# Patient Record
Sex: Female | Born: 1985 | Race: Black or African American | Hispanic: No | Marital: Single | Smoking: Never smoker
Health system: Southern US, Community
[De-identification: ages and names within clinical notes are randomized; demographics above are authoritative.]

## PROBLEM LIST (undated history)

## (undated) DIAGNOSIS — D649 Anemia, unspecified: Secondary | ICD-10-CM

## (undated) DIAGNOSIS — F419 Anxiety disorder, unspecified: Secondary | ICD-10-CM

## (undated) DIAGNOSIS — E119 Type 2 diabetes mellitus without complications: Secondary | ICD-10-CM

## (undated) DIAGNOSIS — O24419 Gestational diabetes mellitus in pregnancy, unspecified control: Secondary | ICD-10-CM

## (undated) HISTORY — DX: Type 2 diabetes mellitus without complications: E11.9

## (undated) HISTORY — DX: Anemia, unspecified: D64.9

## (undated) HISTORY — DX: Anxiety disorder, unspecified: F41.9

## (undated) HISTORY — DX: Gestational diabetes mellitus in pregnancy, unspecified control: O24.419

## (undated) HISTORY — PX: THERAPEUTIC ABORTION: SHX798

---

## 2019-04-20 NOTE — L&D Delivery Note (Addendum)
Morgan Foster is a 34 y.o.@ s/p vaginal delivery at [redacted]w[redacted]d.  She was admitted for IOL in the setting of poorly controlled A2GDM.    ROM: 0h 75m with clear fluid GBS Status: positive> treated with PCN Maximum Maternal Temperature: 99.2 F   Labor Progress: Pt in latent labor on admission.  She continued to progress well. Pitocin was initiated and titrated appropriately. AROM was performed for clear fluid. Patient then was noted to have complete cervical dilation. She then delivered without complication as noted below.   Delivery Date/Time: 03/27/2020 at 2129 Delivery: Called to room and patient was complete and pushing. Head delivered LOA. No nuchal cord present. Shoulder and body delivered in usual fashion. Infant with spontaneous cry, placed on mother's abdomen, dried and stimulated. Cord clamped x 2 after 1-minute delay, and cut by father of baby under my direct supervision. Cord blood drawn. Placenta delivered spontaneously with gentle cord traction. Fundus firm with massage and Pitocin. Labia, perineum, vagina, and cervix were inspected; 1st degree laceration visualized with repair.    Placenta: intact, 3-vessel cord, sent to L&D Complications: none Lacerations: 1st degree right labial with repair EBL: 680 ml Analgesia: fentanyl prn   Infant: female  APGARs 8 & 9  weight per medical record   All of the above by Dr. Reece Leader under my direct supervision.  Dr Shawnie Pons also in room d/t hx SD.   Morgan Foster, CNM

## 2020-01-30 ENCOUNTER — Inpatient Hospital Stay (HOSPITAL_COMMUNITY): Admit: 2020-01-30 | Payer: Self-pay | Admitting: Obstetrics & Gynecology

## 2020-02-14 LAB — SICKLE CELL SCREEN: Sickle Cell Screen: NORMAL

## 2020-02-14 LAB — OB RESULTS CONSOLE RPR: RPR: NONREACTIVE

## 2020-02-14 LAB — OB RESULTS CONSOLE HGB/HCT, BLOOD
HCT: 25 — AB (ref 29–41)
Hemoglobin: 8.8

## 2020-02-14 LAB — OB RESULTS CONSOLE GC/CHLAMYDIA
Chlamydia: NEGATIVE
Gonorrhea: NEGATIVE

## 2020-02-14 LAB — GLUCOSE TOLERANCE, 1 HOUR: Glucose, 1 Hour GTT: 159

## 2020-02-14 LAB — OB RESULTS CONSOLE HIV ANTIBODY (ROUTINE TESTING): HIV: NONREACTIVE

## 2020-02-18 LAB — GLUCOSE TOLERANCE, 3 HOURS
Glucose, GTT - 1 Hour: 169 (ref ?–200)
Glucose, GTT - 2 Hour: 173 — AB (ref ?–140)
Glucose, GTT - 3 Hour: 137 mg/dL (ref ?–140)
Glucose, GTT - Fasting: 97 mg/dL (ref 80–110)

## 2020-02-21 ENCOUNTER — Encounter: Payer: Self-pay | Attending: Obstetrics & Gynecology | Admitting: Registered"

## 2020-02-21 ENCOUNTER — Other Ambulatory Visit: Payer: Self-pay

## 2020-02-21 ENCOUNTER — Ambulatory Visit: Payer: Self-pay | Admitting: Registered"

## 2020-02-21 DIAGNOSIS — O24419 Gestational diabetes mellitus in pregnancy, unspecified control: Secondary | ICD-10-CM | POA: Insufficient documentation

## 2020-02-21 NOTE — Progress Notes (Signed)
Patient is scheduled for new OB appointment 03/04/20  Patient was seen on 02/21/20 for Gestational Diabetes self-management. EDD Patient states she was told she is measuring 36W.   Patient arrived 40 minutes late for appointment, likely due to having to rely on others for transportation. Today we only had enough time to teach her how to check her blood sugar. Pt states she will return within a week to complete GDM education.  Blood glucose monitor given: Prodigy Lot # [id number] CBG: 130 mg/dL  Patient instructed to monitor glucose levels: FBS: 60 - 95 mg/dl 2 hour: <829 mg/dl  Patient received the following handouts:  Blood glucose Log Sheet  Patient will be seen for follow-up ASAP.

## 2020-02-25 ENCOUNTER — Encounter: Payer: Self-pay | Admitting: *Deleted

## 2020-02-27 ENCOUNTER — Encounter: Payer: Self-pay | Admitting: General Practice

## 2020-02-28 ENCOUNTER — Encounter: Payer: Self-pay | Admitting: *Deleted

## 2020-03-03 ENCOUNTER — Other Ambulatory Visit: Payer: Self-pay | Admitting: *Deleted

## 2020-03-03 ENCOUNTER — Other Ambulatory Visit: Payer: Self-pay

## 2020-03-03 DIAGNOSIS — O24419 Gestational diabetes mellitus in pregnancy, unspecified control: Secondary | ICD-10-CM

## 2020-03-04 ENCOUNTER — Other Ambulatory Visit: Payer: Self-pay

## 2020-03-04 ENCOUNTER — Other Ambulatory Visit: Payer: Self-pay | Admitting: Obstetrics & Gynecology

## 2020-03-04 ENCOUNTER — Encounter: Payer: Self-pay | Admitting: Registered"

## 2020-03-04 ENCOUNTER — Ambulatory Visit (INDEPENDENT_AMBULATORY_CARE_PROVIDER_SITE_OTHER): Payer: Self-pay | Admitting: Obstetrics & Gynecology

## 2020-03-04 ENCOUNTER — Ambulatory Visit: Payer: Self-pay | Admitting: Registered"

## 2020-03-04 ENCOUNTER — Other Ambulatory Visit: Payer: Self-pay | Admitting: *Deleted

## 2020-03-04 ENCOUNTER — Encounter: Payer: Self-pay | Admitting: Obstetrics & Gynecology

## 2020-03-04 ENCOUNTER — Ambulatory Visit: Payer: Self-pay | Attending: Obstetrics and Gynecology

## 2020-03-04 VITALS — BP 124/79 | Wt 231.1 lb

## 2020-03-04 DIAGNOSIS — O24419 Gestational diabetes mellitus in pregnancy, unspecified control: Secondary | ICD-10-CM

## 2020-03-04 DIAGNOSIS — Z363 Encounter for antenatal screening for malformations: Secondary | ICD-10-CM

## 2020-03-04 DIAGNOSIS — O0990 Supervision of high risk pregnancy, unspecified, unspecified trimester: Secondary | ICD-10-CM | POA: Insufficient documentation

## 2020-03-04 LAB — POCT URINALYSIS DIP (DEVICE)
Bilirubin Urine: NEGATIVE
Glucose, UA: 100 mg/dL — AB
Ketones, ur: NEGATIVE mg/dL
Leukocytes,Ua: NEGATIVE
Nitrite: NEGATIVE
Protein, ur: NEGATIVE mg/dL
Specific Gravity, Urine: >=1.03 (ref 1.005–1.030)
Urobilinogen, UA: 0.2 mg/dL (ref 0.0–1.0)
pH: 6.5 (ref 5.0–8.0)

## 2020-03-04 NOTE — Progress Notes (Signed)
Patient was seen on 03/04/20 for follow-up assessment and education for Gestational Diabetes. EDD 04/14/20.  Last visit patient there was only enough time to teach how to check blood sugar. We were able to cover the rest of the material in the handout and discuss some of the meals she is eating now.  Pt states her fiancee is [place] and likes to cook rice and beans but states she only eats very small portions. Pt states today he is cooking an apple pie.  Patient states that she is having a difficult time starting new habits such as taking her vitamins and checking her blood sugar. Pt states she is still in the process of trying to get her things moved here from [place]. Pt states her fiancee is not working so they do not have income to pay rent and is worried about the possibility of getting kicked out of their place.   Pt states she has some cousins who live in [place] and when visiting them decided to stay here. Pt states one cousin is a vegan and this cousin has been trying to help her out with food choices.   The following learning objectives reviewed during follow-up visit:   How to create balanced meals  Role of stress  Discussed barriers to checking blood sugar  Plan:  . monitor glucose levels: FBS: 60 - 95 mg/dl 2 hour: <829 mg/dl . Adjust meals to help bring blood sugar within range . Return in 1 week to review blood sugar log  Patient received the following handouts:  Blood sugar log (one touch)  Patient will be seen for follow-up in 1 weeks or as needed.

## 2020-03-04 NOTE — Progress Notes (Signed)
  Subjective:    Morgan Foster is a [id number] [redacted]w[redacted]d being seen today for her first obstetrical visit.  Her obstetrical history is significant for gestational diabetes and inadequate prenatal care. Patient does intend to breast feed. Pregnancy history fully reviewed. She needs to complete her DM education today and have an anatomy scan  Patient reports occasional contractions and pressure.  Vitals:   03/04/20 0909  BP: 124/79  Weight: 231 lb 1.6 oz (104.8 kg)    HISTORY: OB History  Gravida Para Term Preterm AB Living  7 2 2  0 4 2  SAB TAB Ectopic Multiple Live Births  0 4 0 0 2    # Outcome Date GA Lbr Len/2nd Weight Sex Delivery Anes PTL Lv  7 Current           6 Term 04/04/13 [redacted]w[redacted]d  7 lb 11 oz (3.487 kg) F Vag-Spont     5 Term 12/30/08 [redacted]w[redacted]d  8 lb 2 oz (3.685 kg) F Vag-Spont     4 TAB           3 TAB           2 TAB           1 TAB            No past medical history on file.  Family History  Problem Relation Age of Onset  . Hypertension Mother   . Diabetes Mother      Exam    Uterus:   35 cm FH  Pelvic Exam:                               System:     Skin: normal coloration and turgor, no rashes    Neurologic: oriented, normal mood   Extremities: normal strength, tone, and muscle mass   HEENT PERRLA   Mouth/Teeth    Neck supple   Cardiovascular: regular rate and rhythm   Respiratory:  appears well, vitals normal, no respiratory distress, acyanotic, normal RR   Abdomen: gravid          Assessment:    Pregnancy: [redacted]w[redacted]d Patient Active Problem List   Diagnosis Date Noted  . Supervision of high risk pregnancy, antepartum 03/04/2020  . Gestational diabetes mellitus (GDM), antepartum 02/21/2020        Plan:     Initial labs drawn. Prenatal vitamins. Problem list reviewed and updated. Genetic Screening discussed .  Ultrasound discussed; fetal survey: ordered.  Follow up in 1 weeks. 50% of 30 min visit spent on counseling and coordination  of care.  To 13/07/2019 now, DM eduacation   [place] 03/05/2020

## 2020-03-04 NOTE — Patient Instructions (Signed)
Gestational Diabetes Mellitus, Diagnosis Gestational diabetes (gestational diabetes mellitus) is a short-term (temporary) form of diabetes that can happen during pregnancy. It goes away after you give birth. It may be caused by one or both of these problems:  Your pancreas does not make enough of a hormone called insulin.  Your body does not respond in a normal way to insulin that it makes. Insulin lets sugars (glucose) go into cells in the body. This gives you energy. If you have diabetes, sugars cannot get into cells. This causes high blood sugar (hyperglycemia). If you get gestational diabetes, you are:  More likely to get it if you get pregnant again.  More likely to develop type 2 diabetes in the future. If gestational diabetes is treated, it may not hurt you or your baby. Your doctor will set treatment goals for you. In general, you should have these blood sugar levels:  After not eating for a long time (fasting): 95 mg/dL (5.3 mmol/L).  After meals (postprandial): ? One hour after a meal: at or below 140 mg/dL (7.8 mmol/L). ? Two hours after a meal: at or below 120 mg/dL (6.7 mmol/L).  A1c (hemoglobin A1c) level: 6-6.5%. Follow these instructions at home: Questions to ask your doctor   You may want to ask these questions: ? Do I need to meet with a diabetes educator? ? What equipment will I need to care for myself at home? ? What medicines do I need? When should I take them? ? How often do I need to check my blood sugar? ? What number can I call if I have questions? ? When is my next doctor's visit? General instructions  Take over-the-counter and prescription medicines only as told by your doctor.  Stay at a healthy weight during pregnancy.  Keep all follow-up visits as told by your doctor. This is important. Contact a doctor if:  Your blood sugar is at or above 240 mg/dL (13.3 mmol/L).  Your blood sugar is at or above 200 mg/dL (11.1 mmol/L) and you have ketones in  your pee (urine).  You have been sick or have had a fever for 2 days or more and you are not getting better.  You have any of these problems for more than 6 hours: ? You cannot eat or drink. ? You feel sick to your stomach (nauseous). ? You throw up (vomit). ? You have watery poop (diarrhea). Get help right away if:  Your blood sugar is lower than 54 mg/dL (3 mmol/L).  You get confused.  You have trouble: ? Thinking clearly. ? Breathing.  Your baby moves less than normal.  You have any of these: ? Moderate or large ketone levels in your pee. ? Blood coming from your vagina. ? Unusual fluid coming from your vagina. ? Early contractions. These may feel like tightness in your belly. Summary  Gestational diabetes is a short-term form of diabetes. It can happen while you are pregnant. It goes away after you give birth.  If gestational diabetes is treated, it may not hurt you or your baby. Your doctor will set treatment goals for you.  Keep all follow-up visits as told by your doctor. This is important. This information is not intended to replace advice given to you by your health care provider. Make sure you discuss any questions you have with your health care provider. Document Revised: 05/12/2017 Document Reviewed: 05/09/2015 Elsevier Patient Education  2020 [employer]  

## 2020-03-06 ENCOUNTER — Other Ambulatory Visit: Payer: Self-pay

## 2020-03-11 ENCOUNTER — Ambulatory Visit: Payer: Self-pay | Admitting: *Deleted

## 2020-03-11 ENCOUNTER — Ambulatory Visit (INDEPENDENT_AMBULATORY_CARE_PROVIDER_SITE_OTHER): Payer: Self-pay | Admitting: Family Medicine

## 2020-03-11 ENCOUNTER — Other Ambulatory Visit: Payer: Self-pay

## 2020-03-11 ENCOUNTER — Ambulatory Visit: Payer: Self-pay | Attending: Maternal & Fetal Medicine

## 2020-03-11 ENCOUNTER — Ambulatory Visit: Payer: Self-pay | Admitting: Registered"

## 2020-03-11 ENCOUNTER — Encounter: Payer: Self-pay | Admitting: *Deleted

## 2020-03-11 ENCOUNTER — Encounter: Payer: Self-pay | Admitting: Registered"

## 2020-03-11 VITALS — BP 110/54 | HR 87

## 2020-03-11 VITALS — BP 109/72 | HR 96 | Wt 234.9 lb

## 2020-03-11 DIAGNOSIS — O0990 Supervision of high risk pregnancy, unspecified, unspecified trimester: Secondary | ICD-10-CM

## 2020-03-11 DIAGNOSIS — O24410 Gestational diabetes mellitus in pregnancy, diet controlled: Secondary | ICD-10-CM | POA: Insufficient documentation

## 2020-03-11 DIAGNOSIS — O403XX0 Polyhydramnios, third trimester, not applicable or unspecified: Secondary | ICD-10-CM

## 2020-03-11 DIAGNOSIS — O24419 Gestational diabetes mellitus in pregnancy, unspecified control: Secondary | ICD-10-CM

## 2020-03-11 DIAGNOSIS — Z3A35 35 weeks gestation of pregnancy: Secondary | ICD-10-CM

## 2020-03-11 NOTE — Progress Notes (Signed)
Patient was seen on 03/11/20 for follow-up assessment and education for Gestational Diabetes. EDD 04/14/20. [redacted]w[redacted]d  Patient states that she continues to have difficultly remembering to take vitamins and check blood sugar. Pt states she has only checked once since last visit.  Yesterday FBS was 110 mg/dL  Dietary recall: Breakfast: frozen waffle, small drizzle syrup, water Snack: fruit Lunch: sandwich Dinner: yellow rice (1 c), bbq ribs, broccoli, corn on the cob with sugar and butter.  The following learning objectives reviewed during follow-up visit:   Importance of monitoring blood sugar readings  Identifying carbs in diet  Timing of dinner and potential effect on fasting blood sugar   Plan:  . Set alarm on phone to remember to monitor glucose levels: FBS: 60 - 95 mg/dl 2 hour: <045 mg/dl . Review carb list on yellow card . Adjust meals to help bring blood sugar within range . Return in 1 week to review blood sugar log  Patient received the following handouts:  Blood sugar log (one touch)  Patient will be seen for follow-up in 1 weeks or as needed.

## 2020-03-11 NOTE — Progress Notes (Signed)
   Subjective:  Morgan Foster is a 34 y.o. [id number] at [redacted]w[redacted]d being seen today for ongoing prenatal care.  She is currently monitored for the following issues for this high-risk pregnancy and has Gestational diabetes mellitus (GDM), antepartum and Supervision of high risk pregnancy, antepartum on their problem list.  Patient reports no complaints.  Contractions: Irregular. Vag. Bleeding: None.  Movement: Present. Denies leaking of fluid.   The following portions of the patient's history were reviewed and updated as appropriate: allergies, current medications, past family history, past medical history, past social history, past surgical history and problem list. Problem list updated.  Objective:   Vitals:   03/11/20 1332  BP: 109/72  Pulse: 96  Weight: 234 lb 14.4 oz (106.5 kg)    Fetal Status: Fetal Heart Rate (bpm): 143   Movement: Present  Presentation: Vertex  General:  Alert, oriented and cooperative. Patient is in no acute distress.  Skin: Skin is warm and dry. No rash noted.   Cardiovascular: Normal heart rate noted  Respiratory: Normal respiratory effort, no problems with respiration noted  Abdomen: Soft, gravid, appropriate for gestational age. Pain/Pressure: Present     Pelvic: Vag. Bleeding: None     Cervical exam performed Dilation: 1 Effacement (%): 50 Station: Ballotable  Extremities: Normal range of motion.  Edema: None  Mental Status: Normal mood and affect. Normal behavior. Normal judgment and thought content.   Urinalysis:      Assessment and Plan:  Pregnancy: [id number] at [redacted]w[redacted]d  1. Supervision of high risk pregnancy, antepartum Transfer from [facility] for GDMA1 at 33 weeks, failed 3hr GTT Doing well, normal FHR and BP Needs swabs next visit Interested in tubal ligation but still uncertain, cutting it close for 30d mark from delivery BTL papers signed today  2. Gestational diabetes mellitus (GDM), antepartum, gestational diabetes method of control  unspecified Reports FBS yesterday 110, has not been checking sugars regularly Reviewed risks of uncontrolled GDM Met with Nutrition today, instructed to check sugars QID and bring log to visit next week Most recent growth [place] on 11/16 showed EFW >99%, 3155g Had BPP 8/8 today, f/u BPP and growths already scheduled   Preterm labor symptoms and general obstetric precautions including but not limited to vaginal bleeding, contractions, leaking of fluid and fetal movement were reviewed in detail with the patient. Please refer to After Visit Summary for other counseling recommendations.  Return in 1 week (on 03/18/2020) for [facility].   Clarnce Flock, MD

## 2020-03-11 NOTE — Patient Instructions (Signed)
 Third Trimester of Pregnancy The third trimester is from week 28 through week 40 (months 7 through 9). The third trimester is a time when the unborn baby (fetus) is growing rapidly. At the end of the ninth month, the fetus is about 20 inches in length and weighs 6-10 pounds. Body changes during your third trimester Your body will continue to go through many changes during pregnancy. The changes vary from woman to woman. During the third trimester:  Your weight will continue to increase. You can expect to gain 25-35 pounds (11-16 kg) by the end of the pregnancy.  You may begin to get stretch marks on your hips, abdomen, and breasts.  You may urinate more often because the fetus is moving lower into your pelvis and pressing on your bladder.  You may develop or continue to have heartburn. This is caused by increased hormones that slow down muscles in the digestive tract.  You may develop or continue to have constipation because increased hormones slow digestion and cause the muscles that push waste through your intestines to relax.  You may develop hemorrhoids. These are swollen veins (varicose veins) in the rectum that can itch or be painful.  You may develop swollen, bulging veins (varicose veins) in your legs.  You may have increased body aches in the pelvis, back, or thighs. This is due to weight gain and increased hormones that are relaxing your joints.  You may have changes in your hair. These can include thickening of your hair, rapid growth, and changes in texture. Some women also have hair loss during or after pregnancy, or hair that feels dry or thin. Your hair will most likely return to normal after your baby is born.  Your breasts will continue to grow and they will continue to become tender. A yellow fluid (colostrum) may leak from your breasts. This is the first milk you are producing for your baby.  Your belly button may stick out.  You may notice more swelling in your  hands, face, or ankles.  You may have increased tingling or numbness in your hands, arms, and legs. The skin on your belly may also feel numb.  You may feel short of breath because of your expanding uterus.  You may have more problems sleeping. This can be caused by the size of your belly, increased need to urinate, and an increase in your body's metabolism.  You may notice the fetus "dropping," or moving lower in your abdomen (lightening).  You may have increased vaginal discharge.  You may notice your joints feel loose and you may have pain around your pelvic bone. What to expect at prenatal visits You will have prenatal exams every 2 weeks until week 36. Then you will have weekly prenatal exams. During a routine prenatal visit:  You will be weighed to make sure you and the baby are growing normally.  Your blood pressure will be taken.  Your abdomen will be measured to track your baby's growth.  The fetal heartbeat will be listened to.  Any test results from the previous visit will be discussed.  You may have a cervical check near your due date to see if your cervix has softened or thinned (effaced).  You will be tested for Group B streptococcus. This happens between 35 and 37 weeks. Your health care provider may ask you:  What your birth plan is.  How you are feeling.  If you are feeling the baby move.  If you have had any   abnormal symptoms, such as leaking fluid, bleeding, severe headaches, or abdominal cramping.  If you are using any tobacco products, including cigarettes, chewing tobacco, and electronic cigarettes.  If you have any questions. Other tests or screenings that may be performed during your third trimester include:  Blood tests that check for low iron levels (anemia).  Fetal testing to check the health, activity level, and growth of the fetus. Testing is done if you have certain medical conditions or if there are problems during the  pregnancy.  Nonstress test (NST). This test checks the health of your baby to make sure there are no signs of problems, such as the baby not getting enough oxygen. During this test, a belt is placed around your belly. The baby is made to move, and its heart rate is monitored during movement. What is false labor? False labor is a condition in which you feel small, irregular tightenings of the muscles in the womb (contractions) that usually go away with rest, changing position, or drinking water. These are called Braxton Hicks contractions. Contractions may last for hours, days, or even weeks before true labor sets in. If contractions come at regular intervals, become more frequent, increase in intensity, or become painful, you should see your health care provider. What are the signs of labor?  Abdominal cramps.  Regular contractions that start at 10 minutes apart and become stronger and more frequent with time.  Contractions that start on the top of the uterus and spread down to the lower abdomen and back.  Increased pelvic pressure and dull back pain.  A watery or bloody mucus discharge that comes from the vagina.  Leaking of amniotic fluid. This is also known as your "water breaking." It could be a slow trickle or a gush. Let your health care provider know if it has a color or strange odor. If you have any of these signs, call your health care provider right away, even if it is before your due date. Follow these instructions at home: Medicines  Follow your health care provider's instructions regarding medicine use. Specific medicines may be either safe or unsafe to take during pregnancy.  Take a prenatal vitamin that contains at least 600 micrograms (mcg) of folic acid.  If you develop constipation, try taking a stool softener if your health care provider approves. Eating and drinking   Eat a balanced diet that includes fresh fruits and vegetables, whole grains, good sources of protein  such as meat, eggs, or tofu, and low-fat dairy. Your health care provider will help you determine the amount of weight gain that is right for you.  Avoid raw meat and uncooked cheese. These carry germs that can cause birth defects in the baby.  If you have low calcium intake from food, talk to your health care provider about whether you should take a daily calcium supplement.  Eat four or five small meals rather than three large meals a day.  Limit foods that are high in fat and processed sugars, such as fried and sweet foods.  To prevent constipation: ? Drink enough fluid to keep your urine clear or pale yellow. ? Eat foods that are high in fiber, such as fresh fruits and vegetables, whole grains, and beans. Activity  Exercise only as directed by your health care provider. Most women can continue their usual exercise routine during pregnancy. Try to exercise for 30 minutes at least 5 days a week. Stop exercising if you experience uterine contractions.  Avoid heavy lifting.    Do not exercise in extreme heat or humidity, or at high altitudes.  Wear low-heel, comfortable shoes.  Practice good posture.  You may continue to have sex unless your health care provider tells you otherwise. Relieving pain and discomfort  Take frequent breaks and rest with your legs elevated if you have leg cramps or low back pain.  Take warm sitz baths to soothe any pain or discomfort caused by hemorrhoids. Use hemorrhoid cream if your health care provider approves.  Wear a good support bra to prevent discomfort from breast tenderness.  If you develop varicose veins: ? Wear support pantyhose or compression stockings as told by your healthcare provider. ? Elevate your feet for 15 minutes, 3-4 times a day. Prenatal care  Write down your questions. Take them to your prenatal visits.  Keep all your prenatal visits as told by your health care provider. This is important. Safety  Wear your seat belt at  all times when driving.  Make a list of emergency phone numbers, including numbers for family, friends, the hospital, and police and fire departments. General instructions  Avoid cat litter boxes and soil used by cats. These carry germs that can cause birth defects in the baby. If you have a cat, ask someone to clean the litter box for you.  Do not travel far distances unless it is absolutely necessary and only with the approval of your health care provider.  Do not use hot tubs, steam rooms, or saunas.  Do not drink alcohol.  Do not use any products that contain nicotine or tobacco, such as cigarettes and e-cigarettes. If you need help quitting, ask your health care provider.  Do not use any medicinal herbs or unprescribed drugs. These chemicals affect the formation and growth of the baby.  Do not douche or use tampons or scented sanitary pads.  Do not cross your legs for long periods of time.  To prepare for the arrival of your baby: ? Take prenatal classes to understand, practice, and ask questions about labor and delivery. ? Make a trial run to the hospital. ? Visit the hospital and tour the maternity area. ? Arrange for maternity or paternity leave through employers. ? Arrange for family and friends to take care of pets while you are in the hospital. ? Purchase a rear-facing car seat and make sure you know how to install it in your car. ? Pack your hospital bag. ? Prepare the baby's nursery. Make sure to remove all pillows and stuffed animals from the baby's crib to prevent suffocation.  Visit your dentist if you have not gone during your pregnancy. Use a soft toothbrush to brush your teeth and be gentle when you floss. Contact a health care provider if:  You are unsure if you are in labor or if your water has broken.  You become dizzy.  You have mild pelvic cramps, pelvic pressure, or nagging pain in your abdominal area.  You have lower back pain.  You have persistent  nausea, vomiting, or diarrhea.  You have an unusual or bad smelling vaginal discharge.  You have pain when you urinate. Get help right away if:  Your water breaks before 37 weeks.  You have regular contractions less than 5 minutes apart before 37 weeks.  You have a fever.  You are leaking fluid from your vagina.  You have spotting or bleeding from your vagina.  You have severe abdominal pain or cramping.  You have rapid weight loss or weight gain.  You   have shortness of breath with chest pain.  You notice sudden or extreme swelling of your face, hands, ankles, feet, or legs.  Your baby makes fewer than 10 movements in 2 hours.  You have severe headaches that do not go away when you take medicine.  You have vision changes. Summary  The third trimester is from week 28 through week 40, months 7 through 9. The third trimester is a time when the unborn baby (fetus) is growing rapidly.  During the third trimester, your discomfort may increase as you and your baby continue to gain weight. You may have abdominal, leg, and back pain, sleeping problems, and an increased need to urinate.  During the third trimester your breasts will keep growing and they will continue to become tender. A yellow fluid (colostrum) may leak from your breasts. This is the first milk you are producing for your baby.  False labor is a condition in which you feel small, irregular tightenings of the muscles in the womb (contractions) that eventually go away. These are called Braxton Hicks contractions. Contractions may last for hours, days, or even weeks before true labor sets in.  Signs of labor can include: abdominal cramps; regular contractions that start at 10 minutes apart and become stronger and more frequent with time; watery or bloody mucus discharge that comes from the vagina; increased pelvic pressure and dull back pain; and leaking of amniotic fluid. This information is not intended to replace advice  given to you by your health care provider. Make sure you discuss any questions you have with your health care provider. Document Revised: 07/27/2018 Document Reviewed: 05/11/2016 Else[employer] Patient Education  2020 [employer]   Contraception Choices Contraception, also called birth control, refers to methods or devices that prevent pregnancy. Hormonal methods Contraceptive implant  A contraceptive implant is a thin, plastic tube that contains a hormone. It is inserted into the upper part of the arm. It can remain in place for up to 3 years. Progestin-only injections Progestin-only injections are injections of progestin, a synthetic form of the hormone progesterone. They are given every 3 months by a health care provider. Birth control pills  Birth control pills are pills that contain hormones that prevent pregnancy. They must be taken once a day, preferably at the same time each day. Birth control patch  The birth control patch contains hormones that prevent pregnancy. It is placed on the skin and must be changed once a week for three weeks and removed on the fourth week. A prescription is needed to use this method of contraception. Vaginal ring  A vaginal ring contains hormones that prevent pregnancy. It is placed in the vagina for three weeks and removed on the fourth week. After that, the process is repeated with a new ring. A prescription is needed to use this method of contraception. Emergency contraceptive Emergency contraceptives prevent pregnancy after unprotected sex. They come in pill form and can be taken up to 5 days after sex. They work best the sooner they are taken after having sex. Most emergency contraceptives are available without a prescription. This method should not be used as your only form of birth control. Barrier methods Female condom  A female condom is a thin sheath that is worn over the penis during sex. Condoms keep sperm from going inside a woman's body. They can  be used with a spermicide to increase their effectiveness. They should be disposed after a single use. Female condom  A female condom is a soft,   loose-fitting sheath that is put into the vagina before sex. The condom keeps sperm from going inside a woman's body. They should be disposed after a single use. Diaphragm  A diaphragm is a soft, dome-shaped barrier. It is inserted into the vagina before sex, along with a spermicide. The diaphragm blocks sperm from entering the uterus, and the spermicide kills sperm. A diaphragm should be left in the vagina for 6-8 hours after sex and removed within 24 hours. A diaphragm is prescribed and fitted by a health care provider. A diaphragm should be replaced every 1-2 years, after giving birth, after gaining more than 15 lb (6.8 kg), and after pelvic surgery. Cervical cap  A cervical cap is a round, soft latex or plastic cup that fits over the cervix. It is inserted into the vagina before sex, along with spermicide. It blocks sperm from entering the uterus. The cap should be left in place for 6-8 hours after sex and removed within 48 hours. A cervical cap must be prescribed and fitted by a health care provider. It should be replaced every 2 years. Sponge  A sponge is a soft, circular piece of polyurethane foam with spermicide on it. The sponge helps block sperm from entering the uterus, and the spermicide kills sperm. To use it, you make it wet and then insert it into the vagina. It should be inserted before sex, left in for at least 6 hours after sex, and removed and thrown away within 30 hours. Spermicides Spermicides are chemicals that kill or block sperm from entering the cervix and uterus. They can come as a cream, jelly, suppository, foam, or tablet. A spermicide should be inserted into the vagina with an applicator at least 10-15 minutes before sex to allow time for it to work. The process must be repeated every time you have sex. Spermicides do not require  a prescription. Intrauterine contraception Intrauterine device (IUD) An IUD is a T-shaped device that is put in a woman's uterus. There are two types:  Hormone IUD.This type contains progestin, a synthetic form of the hormone progesterone. This type can stay in place for 3-5 years.  Copper IUD.This type is wrapped in copper wire. It can stay in place for 10 years.  Permanent methods of contraception Female tubal ligation In this method, a woman's fallopian tubes are sealed, tied, or blocked during surgery to prevent eggs from traveling to the uterus. Hysteroscopic sterilization In this method, a small, flexible insert is placed into each fallopian tube. The inserts cause scar tissue to form in the fallopian tubes and block them, so sperm cannot reach an egg. The procedure takes about 3 months to be effective. Another form of birth control must be used during those 3 months. Female sterilization This is a procedure to tie off the tubes that carry sperm (vasectomy). After the procedure, the man can still ejaculate fluid (semen). Natural planning methods Natural family planning In this method, a couple does not have sex on days when the woman could become pregnant. Calendar method This means keeping track of the length of each menstrual cycle, identifying the days when pregnancy can happen, and not having sex on those days. Ovulation method In this method, a couple avoids sex during ovulation. Symptothermal method This method involves not having sex during ovulation. The woman typically checks for ovulation by watching changes in her temperature and in the consistency of cervical mucus. Post-ovulation method In this method, a couple waits to have sex until after ovulation. Summary    Contraception, also called birth control, means methods or devices that prevent pregnancy.  Hormonal methods of contraception include implants, injections, pills, patches, vaginal rings, and emergency  contraceptives.  Barrier methods of contraception can include female condoms, female condoms, diaphragms, cervical caps, sponges, and spermicides.  There are two types of IUDs (intrauterine devices). An IUD can be put in a woman's uterus to prevent pregnancy for 3-5 years.  Permanent sterilization can be done through a procedure for males, females, or both.  Natural family planning methods involve not having sex on days when the woman could become pregnant. This information is not intended to replace advice given to you by your health care provider. Make sure you discuss any questions you have with your health care provider. Document Revised: 04/07/2017 Document Reviewed: 05/08/2016 Else[employer] Patient Education  2020 [employer]   Breastfeeding  Choosing to breastfeed is one of the best decisions you can make for yourself and your baby. A change in hormones during pregnancy causes your breasts to make breast milk in your milk-producing glands. Hormones prevent breast milk from being released before your baby is born. They also prompt milk flow after birth. Once breastfeeding has begun, thoughts of your baby, as well as his or her sucking or crying, can stimulate the release of milk from your milk-producing glands. Benefits of breastfeeding Research shows that breastfeeding offers many health benefits for infants and mothers. It also offers a cost-free and convenient way to feed your baby. For your baby  Your first milk (colostrum) helps your baby's digestive system to function better.  Special cells in your milk (antibodies) help your baby to fight off infections.  Breastfed babies are less likely to develop asthma, allergies, obesity, or type 2 diabetes. They are also at lower risk for sudden infant death syndrome (SIDS).  Nutrients in breast milk are better able to meet your baby's needs compared to infant formula.  Breast milk improves your baby's brain development. For  you  Breastfeeding helps to create a very special bond between you and your baby.  Breastfeeding is convenient. Breast milk costs nothing and is always available at the correct temperature.  Breastfeeding helps to burn calories. It helps you to lose the weight that you gained during pregnancy.  Breastfeeding makes your uterus return faster to its size before pregnancy. It also slows bleeding (lochia) after you give birth.  Breastfeeding helps to lower your risk of developing type 2 diabetes, osteoporosis, rheumatoid arthritis, cardiovascular disease, and breast, ovarian, uterine, and endometrial cancer later in life. Breastfeeding basics Starting breastfeeding  Find a comfortable place to sit or lie down, with your neck and back well-supported.  Place a pillow or a rolled-up blanket under your baby to bring him or her to the level of your breast (if you are seated). Nursing pillows are specially designed to help support your arms and your baby while you breastfeed.  Make sure that your baby's tummy (abdomen) is facing your abdomen.  Gently massage your breast. With your fingertips, massage from the outer edges of your breast inward toward the nipple. This encourages milk flow. If your milk flows slowly, you may need to continue this action during the feeding.  Support your breast with 4 fingers underneath and your thumb above your nipple (make the letter "C" with your hand). Make sure your fingers are well away from your nipple and your baby's mouth.  Stroke your baby's lips gently with your finger or nipple.  When your baby's mouth is open   wide enough, quickly bring your baby to your breast, placing your entire nipple and as much of the areola as possible into your baby's mouth. The areola is the colored area around your nipple. ? More areola should be visible above your baby's upper lip than below the lower lip. ? Your baby's lips should be opened and extended outward (flanged) to  ensure an adequate, comfortable latch. ? Your baby's tongue should be between his or her lower gum and your breast.  Make sure that your baby's mouth is correctly positioned around your nipple (latched). Your baby's lips should create a seal on your breast and be turned out (everted).  It is common for your baby to suck about 2-3 minutes in order to start the flow of breast milk. Latching Teaching your baby how to latch onto your breast properly is very important. An improper latch can cause nipple pain, decreased milk supply, and poor weight gain in your baby. Also, if your baby is not latched onto your nipple properly, he or she may swallow some air during feeding. This can make your baby fussy. Burping your baby when you switch breasts during the feeding can help to get rid of the air. However, teaching your baby to latch on properly is still the best way to prevent fussiness from swallowing air while breastfeeding. Signs that your baby has successfully latched onto your nipple  Silent tugging or silent sucking, without causing you pain. Infant's lips should be extended outward (flanged).  Swallowing heard between every 3-4 sucks once your milk has started to flow (after your let-down milk reflex occurs).  Muscle movement above and in front of his or her ears while sucking. Signs that your baby has not successfully latched onto your nipple  Sucking sounds or smacking sounds from your baby while breastfeeding.  Nipple pain. If you think your baby has not latched on correctly, slip your finger into the corner of your baby's mouth to break the suction and place it between your baby's gums. Attempt to start breastfeeding again. Signs of successful breastfeeding Signs from your baby  Your baby will gradually decrease the number of sucks or will completely stop sucking.  Your baby will fall asleep.  Your baby's body will relax.  Your baby will retain a small amount of milk in his or her  mouth.  Your baby will let go of your breast by himself or herself. Signs from you  Breasts that have increased in firmness, weight, and size 1-3 hours after feeding.  Breasts that are softer immediately after breastfeeding.  Increased milk volume, as well as a change in milk consistency and color by the fifth day of breastfeeding.  Nipples that are not sore, cracked, or bleeding. Signs that your baby is getting enough milk  Wetting at least 1-2 diapers during the first 24 hours after birth.  Wetting at least 5-6 diapers every 24 hours for the first week after birth. The urine should be clear or pale yellow by the age of 5 days.  Wetting 6-8 diapers every 24 hours as your baby continues to grow and develop.  At least 3 stools in a 24-hour period by the age of 5 days. The stool should be soft and yellow.  At least 3 stools in a 24-hour period by the age of 7 days. The stool should be seedy and yellow.  No loss of weight greater than 10% of birth weight during the first 3 days of life.  Average weight gain   of 4-7 oz (113-198 g) per week after the age of 4 days.  Consistent daily weight gain by the age of 5 days, without weight loss after the age of 2 weeks. After a feeding, your baby may spit up a small amount of milk. This is normal. Breastfeeding frequency and duration Frequent feeding will help you make more milk and can prevent sore nipples and extremely full breasts (breast engorgement). Breastfeed when you feel the need to reduce the fullness of your breasts or when your baby shows signs of hunger. This is called "breastfeeding on demand." Signs that your baby is hungry include:  Increased alertness, activity, or restlessness.  Movement of the head from side to side.  Opening of the mouth when the corner of the mouth or cheek is stroked (rooting).  Increased sucking sounds, smacking lips, cooing, sighing, or squeaking.  Hand-to-mouth movements and sucking on fingers or  hands.  Fussing or crying. Avoid introducing a pacifier to your baby in the first 4-6 weeks after your baby is born. After this time, you may choose to use a pacifier. Research has shown that pacifier use during the first year of a baby's life decreases the risk of sudden infant death syndrome (SIDS). Allow your baby to feed on each breast as long as he or she wants. When your baby unlatches or falls asleep while feeding from the first breast, offer the second breast. Because newborns are often sleepy in the first few weeks of life, you may need to awaken your baby to get him or her to feed. Breastfeeding times will vary from baby to baby. However, the following rules can serve as a guide to help you make sure that your baby is properly fed:  Newborns (babies 4 weeks of age or younger) may breastfeed every 1-3 hours.  Newborns should not go without breastfeeding for longer than 3 hours during the day or 5 hours during the night.  You should breastfeed your baby a minimum of 8 times in a 24-hour period. Breast milk pumping     Pumping and storing breast milk allows you to make sure that your baby is exclusively fed your breast milk, even at times when you are unable to breastfeed. This is especially important if you go back to work while you are still breastfeeding, or if you are not able to be present during feedings. Your lactation consultant can help you find a method of pumping that works best for you and give you guidelines about how long it is safe to store breast milk. Caring for your breasts while you breastfeed Nipples can become dry, cracked, and sore while breastfeeding. The following recommendations can help keep your breasts moisturized and healthy:  Avoid using soap on your nipples.  Wear a supportive bra designed especially for nursing. Avoid wearing underwire-style bras or extremely tight bras (sports bras).  Air-dry your nipples for 3-4 minutes after each feeding.  Use only  cotton bra pads to absorb leaked breast milk. Leaking of breast milk between feedings is normal.  Use lanolin on your nipples after breastfeeding. Lanolin helps to maintain your skin's normal moisture barrier. Pure lanolin is not harmful (not toxic) to your baby. You may also hand express a few drops of breast milk and gently massage that milk into your nipples and allow the milk to air-dry. In the first few weeks after giving birth, some women experience breast engorgement. Engorgement can make your breasts feel heavy, warm, and tender to the touch. Engorgement   peaks within 3-5 days after you give birth. The following recommendations can help to ease engorgement:  Completely empty your breasts while breastfeeding or pumping. You may want to start by applying warm, moist heat (in the shower or with warm, water-soaked hand towels) just before feeding or pumping. This increases circulation and helps the milk flow. If your baby does not completely empty your breasts while breastfeeding, pump any extra milk after he or she is finished.  Apply ice packs to your breasts immediately after breastfeeding or pumping, unless this is too uncomfortable for you. To do this: ? Put ice in a plastic bag. ? Place a towel between your skin and the bag. ? Leave the ice on for 20 minutes, 2-3 times a day.  Make sure that your baby is latched on and positioned properly while breastfeeding. If engorgement persists after 48 hours of following these recommendations, contact your health care provider or a lactation consultant. Overall health care recommendations while breastfeeding  Eat 3 healthy meals and 3 snacks every day. Well-nourished mothers who are breastfeeding need an additional 450-500 calories a day. You can meet this requirement by increasing the amount of a balanced diet that you eat.  Drink enough water to keep your urine pale yellow or clear.  Rest often, relax, and continue to take your prenatal vitamins  to prevent fatigue, stress, and low vitamin and mineral levels in your body (nutrient deficiencies).  Do not use any products that contain nicotine or tobacco, such as cigarettes and e-cigarettes. Your baby may be harmed by chemicals from cigarettes that pass into breast milk and exposure to secondhand smoke. If you need help quitting, ask your health care provider.  Avoid alcohol.  Do not use illegal drugs or marijuana.  Talk with your health care provider before taking any medicines. These include over-the-counter and prescription medicines as well as vitamins and herbal supplements. Some medicines that may be harmful to your baby can pass through breast milk.  It is possible to become pregnant while breastfeeding. If birth control is desired, ask your health care provider about options that will be safe while breastfeeding your baby. Where to find more information: [employer]: www.llli.org Contact a health care provider if:  You feel like you want to stop breastfeeding or have become frustrated with breastfeeding.  Your nipples are cracked or bleeding.  Your breasts are red, tender, or warm.  You have: ? Painful breasts or nipples. ? A swollen area on either breast. ? A fever or chills. ? Nausea or vomiting. ? Drainage other than breast milk from your nipples.  Your breasts do not become full before feedings by the fifth day after you give birth.  You feel sad and depressed.  Your baby is: ? Too sleepy to eat well. ? Having trouble sleeping. ? More than 1 week old and wetting fewer than 6 diapers in a 24-hour period. ? Not gaining weight by 5 days of age.  Your baby has fewer than 3 stools in a 24-hour period.  Your baby's skin or the white parts of his or her eyes become yellow. Get help right away if:  Your baby is overly tired (lethargic) and does not want to wake up and feed.  Your baby develops an unexplained fever. Summary  Breastfeeding  offers many health benefits for infant and mothers.  Try to breastfeed your infant when he or she shows early signs of hunger.  Gently tickle or stroke your baby's lips with   your finger or nipple to allow the baby to open his or her mouth. Bring the baby to your breast. Make sure that much of the areola is in your baby's mouth. Offer one side and burp the baby before you offer the other side.  Talk with your health care provider or lactation consultant if you have questions or you face problems as you breastfeed. This information is not intended to replace advice given to you by your health care provider. Make sure you discuss any questions you have with your health care provider. Document Revised: 06/30/2017 Document Reviewed: 05/07/2016 Elsevier Patient Education  2020 Elsevier Inc.  

## 2020-03-17 ENCOUNTER — Other Ambulatory Visit: Payer: Self-pay

## 2020-03-18 ENCOUNTER — Encounter: Payer: Self-pay | Admitting: Family Medicine

## 2020-03-18 ENCOUNTER — Ambulatory Visit (INDEPENDENT_AMBULATORY_CARE_PROVIDER_SITE_OTHER): Payer: Self-pay | Admitting: Family Medicine

## 2020-03-18 ENCOUNTER — Other Ambulatory Visit (HOSPITAL_COMMUNITY)
Admission: RE | Admit: 2020-03-18 | Discharge: 2020-03-18 | Disposition: A | Discharge status: Home / Self Care | Payer: Self-pay | Source: Ambulatory Visit | Attending: Family Medicine | Admitting: Family Medicine

## 2020-03-18 VITALS — BP 115/75 | HR 97 | Ht 68.5 in | Wt 235.1 lb

## 2020-03-18 DIAGNOSIS — O0990 Supervision of high risk pregnancy, unspecified, unspecified trimester: Secondary | ICD-10-CM | POA: Insufficient documentation

## 2020-03-18 DIAGNOSIS — O24415 Gestational diabetes mellitus in pregnancy, controlled by oral hypoglycemic drugs: Secondary | ICD-10-CM

## 2020-03-18 MED ORDER — METFORMIN HCL 500 MG PO TABS: 500.0000 mg | ORAL_TABLET | Freq: Two times a day (BID) | ORAL | 1 refills | Status: DC

## 2020-03-18 NOTE — Patient Instructions (Signed)
 Third Trimester of Pregnancy The third trimester is from week 28 through week 40 (months 7 through 9). The third trimester is a time when the unborn baby (fetus) is growing rapidly. At the end of the ninth month, the fetus is about 20 inches in length and weighs 6-10 pounds. Body changes during your third trimester Your body will continue to go through many changes during pregnancy. The changes vary from woman to woman. During the third trimester:  Your weight will continue to increase. You can expect to gain 25-35 pounds (11-16 kg) by the end of the pregnancy.  You may begin to get stretch marks on your hips, abdomen, and breasts.  You may urinate more often because the fetus is moving lower into your pelvis and pressing on your bladder.  You may develop or continue to have heartburn. This is caused by increased hormones that slow down muscles in the digestive tract.  You may develop or continue to have constipation because increased hormones slow digestion and cause the muscles that push waste through your intestines to relax.  You may develop hemorrhoids. These are swollen veins (varicose veins) in the rectum that can itch or be painful.  You may develop swollen, bulging veins (varicose veins) in your legs.  You may have increased body aches in the pelvis, back, or thighs. This is due to weight gain and increased hormones that are relaxing your joints.  You may have changes in your hair. These can include thickening of your hair, rapid growth, and changes in texture. Some women also have hair loss during or after pregnancy, or hair that feels dry or thin. Your hair will most likely return to normal after your baby is born.  Your breasts will continue to grow and they will continue to become tender. A yellow fluid (colostrum) may leak from your breasts. This is the first milk you are producing for your baby.  Your belly button may stick out.  You may notice more swelling in your  hands, face, or ankles.  You may have increased tingling or numbness in your hands, arms, and legs. The skin on your belly may also feel numb.  You may feel short of breath because of your expanding uterus.  You may have more problems sleeping. This can be caused by the size of your belly, increased need to urinate, and an increase in your body's metabolism.  You may notice the fetus "dropping," or moving lower in your abdomen (lightening).  You may have increased vaginal discharge.  You may notice your joints feel loose and you may have pain around your pelvic bone. What to expect at prenatal visits You will have prenatal exams every 2 weeks until week 36. Then you will have weekly prenatal exams. During a routine prenatal visit:  You will be weighed to make sure you and the baby are growing normally.  Your blood pressure will be taken.  Your abdomen will be measured to track your baby's growth.  The fetal heartbeat will be listened to.  Any test results from the previous visit will be discussed.  You may have a cervical check near your due date to see if your cervix has softened or thinned (effaced).  You will be tested for Group B streptococcus. This happens between 35 and 37 weeks. Your health care provider may ask you:  What your birth plan is.  How you are feeling.  If you are feeling the baby move.  If you have had any   abnormal symptoms, such as leaking fluid, bleeding, severe headaches, or abdominal cramping.  If you are using any tobacco products, including cigarettes, chewing tobacco, and electronic cigarettes.  If you have any questions. Other tests or screenings that may be performed during your third trimester include:  Blood tests that check for low iron levels (anemia).  Fetal testing to check the health, activity level, and growth of the fetus. Testing is done if you have certain medical conditions or if there are problems during the  pregnancy.  Nonstress test (NST). This test checks the health of your baby to make sure there are no signs of problems, such as the baby not getting enough oxygen. During this test, a belt is placed around your belly. The baby is made to move, and its heart rate is monitored during movement. What is false labor? False labor is a condition in which you feel small, irregular tightenings of the muscles in the womb (contractions) that usually go away with rest, changing position, or drinking water. These are called Braxton Hicks contractions. Contractions may last for hours, days, or even weeks before true labor sets in. If contractions come at regular intervals, become more frequent, increase in intensity, or become painful, you should see your health care provider. What are the signs of labor?  Abdominal cramps.  Regular contractions that start at 10 minutes apart and become stronger and more frequent with time.  Contractions that start on the top of the uterus and spread down to the lower abdomen and back.  Increased pelvic pressure and dull back pain.  A watery or bloody mucus discharge that comes from the vagina.  Leaking of amniotic fluid. This is also known as your "water breaking." It could be a slow trickle or a gush. Let your health care provider know if it has a color or strange odor. If you have any of these signs, call your health care provider right away, even if it is before your due date. Follow these instructions at home: Medicines  Follow your health care provider's instructions regarding medicine use. Specific medicines may be either safe or unsafe to take during pregnancy.  Take a prenatal vitamin that contains at least 600 micrograms (mcg) of folic acid.  If you develop constipation, try taking a stool softener if your health care provider approves. Eating and drinking   Eat a balanced diet that includes fresh fruits and vegetables, whole grains, good sources of protein  such as meat, eggs, or tofu, and low-fat dairy. Your health care provider will help you determine the amount of weight gain that is right for you.  Avoid raw meat and uncooked cheese. These carry germs that can cause birth defects in the baby.  If you have low calcium intake from food, talk to your health care provider about whether you should take a daily calcium supplement.  Eat four or five small meals rather than three large meals a day.  Limit foods that are high in fat and processed sugars, such as fried and sweet foods.  To prevent constipation: ? Drink enough fluid to keep your urine clear or pale yellow. ? Eat foods that are high in fiber, such as fresh fruits and vegetables, whole grains, and beans. Activity  Exercise only as directed by your health care provider. Most women can continue their usual exercise routine during pregnancy. Try to exercise for 30 minutes at least 5 days a week. Stop exercising if you experience uterine contractions.  Avoid heavy lifting.    Do not exercise in extreme heat or humidity, or at high altitudes.  Wear low-heel, comfortable shoes.  Practice good posture.  You may continue to have sex unless your health care provider tells you otherwise. Relieving pain and discomfort  Take frequent breaks and rest with your legs elevated if you have leg cramps or low back pain.  Take warm sitz baths to soothe any pain or discomfort caused by hemorrhoids. Use hemorrhoid cream if your health care provider approves.  Wear a good support bra to prevent discomfort from breast tenderness.  If you develop varicose veins: ? Wear support pantyhose or compression stockings as told by your healthcare provider. ? Elevate your feet for 15 minutes, 3-4 times a day. Prenatal care  Write down your questions. Take them to your prenatal visits.  Keep all your prenatal visits as told by your health care provider. This is important. Safety  Wear your seat belt at  all times when driving.  Make a list of emergency phone numbers, including numbers for family, friends, the hospital, and police and fire departments. General instructions  Avoid cat litter boxes and soil used by cats. These carry germs that can cause birth defects in the baby. If you have a cat, ask someone to clean the litter box for you.  Do not travel far distances unless it is absolutely necessary and only with the approval of your health care provider.  Do not use hot tubs, steam rooms, or saunas.  Do not drink alcohol.  Do not use any products that contain nicotine or tobacco, such as cigarettes and e-cigarettes. If you need help quitting, ask your health care provider.  Do not use any medicinal herbs or unprescribed drugs. These chemicals affect the formation and growth of the baby.  Do not douche or use tampons or scented sanitary pads.  Do not cross your legs for long periods of time.  To prepare for the arrival of your baby: ? Take prenatal classes to understand, practice, and ask questions about labor and delivery. ? Make a trial run to the hospital. ? Visit the hospital and tour the maternity area. ? Arrange for maternity or paternity leave through employers. ? Arrange for family and friends to take care of pets while you are in the hospital. ? Purchase a rear-facing car seat and make sure you know how to install it in your car. ? Pack your hospital bag. ? Prepare the baby's nursery. Make sure to remove all pillows and stuffed animals from the baby's crib to prevent suffocation.  Visit your dentist if you have not gone during your pregnancy. Use a soft toothbrush to brush your teeth and be gentle when you floss. Contact a health care provider if:  You are unsure if you are in labor or if your water has broken.  You become dizzy.  You have mild pelvic cramps, pelvic pressure, or nagging pain in your abdominal area.  You have lower back pain.  You have persistent  nausea, vomiting, or diarrhea.  You have an unusual or bad smelling vaginal discharge.  You have pain when you urinate. Get help right away if:  Your water breaks before 37 weeks.  You have regular contractions less than 5 minutes apart before 37 weeks.  You have a fever.  You are leaking fluid from your vagina.  You have spotting or bleeding from your vagina.  You have severe abdominal pain or cramping.  You have rapid weight loss or weight gain.  You   have shortness of breath with chest pain.  You notice sudden or extreme swelling of your face, hands, ankles, feet, or legs.  Your baby makes fewer than 10 movements in 2 hours.  You have severe headaches that do not go away when you take medicine.  You have vision changes. Summary  The third trimester is from week 28 through week 40, months 7 through 9. The third trimester is a time when the unborn baby (fetus) is growing rapidly.  During the third trimester, your discomfort may increase as you and your baby continue to gain weight. You may have abdominal, leg, and back pain, sleeping problems, and an increased need to urinate.  During the third trimester your breasts will keep growing and they will continue to become tender. A yellow fluid (colostrum) may leak from your breasts. This is the first milk you are producing for your baby.  False labor is a condition in which you feel small, irregular tightenings of the muscles in the womb (contractions) that eventually go away. These are called Braxton Hicks contractions. Contractions may last for hours, days, or even weeks before true labor sets in.  Signs of labor can include: abdominal cramps; regular contractions that start at 10 minutes apart and become stronger and more frequent with time; watery or bloody mucus discharge that comes from the vagina; increased pelvic pressure and dull back pain; and leaking of amniotic fluid. This information is not intended to replace advice  given to you by your health care provider. Make sure you discuss any questions you have with your health care provider. Document Revised: 07/27/2018 Document Reviewed: 05/11/2016 Else[employer] Patient Education  2020 [employer]   Contraception Choices Contraception, also called birth control, refers to methods or devices that prevent pregnancy. Hormonal methods Contraceptive implant  A contraceptive implant is a thin, plastic tube that contains a hormone. It is inserted into the upper part of the arm. It can remain in place for up to 3 years. Progestin-only injections Progestin-only injections are injections of progestin, a synthetic form of the hormone progesterone. They are given every 3 months by a health care provider. Birth control pills  Birth control pills are pills that contain hormones that prevent pregnancy. They must be taken once a day, preferably at the same time each day. Birth control patch  The birth control patch contains hormones that prevent pregnancy. It is placed on the skin and must be changed once a week for three weeks and removed on the fourth week. A prescription is needed to use this method of contraception. Vaginal ring  A vaginal ring contains hormones that prevent pregnancy. It is placed in the vagina for three weeks and removed on the fourth week. After that, the process is repeated with a new ring. A prescription is needed to use this method of contraception. Emergency contraceptive Emergency contraceptives prevent pregnancy after unprotected sex. They come in pill form and can be taken up to 5 days after sex. They work best the sooner they are taken after having sex. Most emergency contraceptives are available without a prescription. This method should not be used as your only form of birth control. Barrier methods Female condom  A female condom is a thin sheath that is worn over the penis during sex. Condoms keep sperm from going inside a woman's body. They can  be used with a spermicide to increase their effectiveness. They should be disposed after a single use. Female condom  A female condom is a soft,   loose-fitting sheath that is put into the vagina before sex. The condom keeps sperm from going inside a woman's body. They should be disposed after a single use. Diaphragm  A diaphragm is a soft, dome-shaped barrier. It is inserted into the vagina before sex, along with a spermicide. The diaphragm blocks sperm from entering the uterus, and the spermicide kills sperm. A diaphragm should be left in the vagina for 6-8 hours after sex and removed within 24 hours. A diaphragm is prescribed and fitted by a health care provider. A diaphragm should be replaced every 1-2 years, after giving birth, after gaining more than 15 lb (6.8 kg), and after pelvic surgery. Cervical cap  A cervical cap is a round, soft latex or plastic cup that fits over the cervix. It is inserted into the vagina before sex, along with spermicide. It blocks sperm from entering the uterus. The cap should be left in place for 6-8 hours after sex and removed within 48 hours. A cervical cap must be prescribed and fitted by a health care provider. It should be replaced every 2 years. Sponge  A sponge is a soft, circular piece of polyurethane foam with spermicide on it. The sponge helps block sperm from entering the uterus, and the spermicide kills sperm. To use it, you make it wet and then insert it into the vagina. It should be inserted before sex, left in for at least 6 hours after sex, and removed and thrown away within 30 hours. Spermicides Spermicides are chemicals that kill or block sperm from entering the cervix and uterus. They can come as a cream, jelly, suppository, foam, or tablet. A spermicide should be inserted into the vagina with an applicator at least 10-15 minutes before sex to allow time for it to work. The process must be repeated every time you have sex. Spermicides do not require  a prescription. Intrauterine contraception Intrauterine device (IUD) An IUD is a T-shaped device that is put in a woman's uterus. There are two types:  Hormone IUD.This type contains progestin, a synthetic form of the hormone progesterone. This type can stay in place for 3-5 years.  Copper IUD.This type is wrapped in copper wire. It can stay in place for 10 years.  Permanent methods of contraception Female tubal ligation In this method, a woman's fallopian tubes are sealed, tied, or blocked during surgery to prevent eggs from traveling to the uterus. Hysteroscopic sterilization In this method, a small, flexible insert is placed into each fallopian tube. The inserts cause scar tissue to form in the fallopian tubes and block them, so sperm cannot reach an egg. The procedure takes about 3 months to be effective. Another form of birth control must be used during those 3 months. Female sterilization This is a procedure to tie off the tubes that carry sperm (vasectomy). After the procedure, the man can still ejaculate fluid (semen). Natural planning methods Natural family planning In this method, a couple does not have sex on days when the woman could become pregnant. Calendar method This means keeping track of the length of each menstrual cycle, identifying the days when pregnancy can happen, and not having sex on those days. Ovulation method In this method, a couple avoids sex during ovulation. Symptothermal method This method involves not having sex during ovulation. The woman typically checks for ovulation by watching changes in her temperature and in the consistency of cervical mucus. Post-ovulation method In this method, a couple waits to have sex until after ovulation. Summary    Contraception, also called birth control, means methods or devices that prevent pregnancy.  Hormonal methods of contraception include implants, injections, pills, patches, vaginal rings, and emergency  contraceptives.  Barrier methods of contraception can include female condoms, female condoms, diaphragms, cervical caps, sponges, and spermicides.  There are two types of IUDs (intrauterine devices). An IUD can be put in a woman's uterus to prevent pregnancy for 3-5 years.  Permanent sterilization can be done through a procedure for males, females, or both.  Natural family planning methods involve not having sex on days when the woman could become pregnant. This information is not intended to replace advice given to you by your health care provider. Make sure you discuss any questions you have with your health care provider. Document Revised: 04/07/2017 Document Reviewed: 05/08/2016 Else[employer] Patient Education  2020 [employer]   Breastfeeding  Choosing to breastfeed is one of the best decisions you can make for yourself and your baby. A change in hormones during pregnancy causes your breasts to make breast milk in your milk-producing glands. Hormones prevent breast milk from being released before your baby is born. They also prompt milk flow after birth. Once breastfeeding has begun, thoughts of your baby, as well as his or her sucking or crying, can stimulate the release of milk from your milk-producing glands. Benefits of breastfeeding Research shows that breastfeeding offers many health benefits for infants and mothers. It also offers a cost-free and convenient way to feed your baby. For your baby  Your first milk (colostrum) helps your baby's digestive system to function better.  Special cells in your milk (antibodies) help your baby to fight off infections.  Breastfed babies are less likely to develop asthma, allergies, obesity, or type 2 diabetes. They are also at lower risk for sudden infant death syndrome (SIDS).  Nutrients in breast milk are better able to meet your baby's needs compared to infant formula.  Breast milk improves your baby's brain development. For  you  Breastfeeding helps to create a very special bond between you and your baby.  Breastfeeding is convenient. Breast milk costs nothing and is always available at the correct temperature.  Breastfeeding helps to burn calories. It helps you to lose the weight that you gained during pregnancy.  Breastfeeding makes your uterus return faster to its size before pregnancy. It also slows bleeding (lochia) after you give birth.  Breastfeeding helps to lower your risk of developing type 2 diabetes, osteoporosis, rheumatoid arthritis, cardiovascular disease, and breast, ovarian, uterine, and endometrial cancer later in life. Breastfeeding basics Starting breastfeeding  Find a comfortable place to sit or lie down, with your neck and back well-supported.  Place a pillow or a rolled-up blanket under your baby to bring him or her to the level of your breast (if you are seated). Nursing pillows are specially designed to help support your arms and your baby while you breastfeed.  Make sure that your baby's tummy (abdomen) is facing your abdomen.  Gently massage your breast. With your fingertips, massage from the outer edges of your breast inward toward the nipple. This encourages milk flow. If your milk flows slowly, you may need to continue this action during the feeding.  Support your breast with 4 fingers underneath and your thumb above your nipple (make the letter "C" with your hand). Make sure your fingers are well away from your nipple and your baby's mouth.  Stroke your baby's lips gently with your finger or nipple.  When your baby's mouth is open   wide enough, quickly bring your baby to your breast, placing your entire nipple and as much of the areola as possible into your baby's mouth. The areola is the colored area around your nipple. ? More areola should be visible above your baby's upper lip than below the lower lip. ? Your baby's lips should be opened and extended outward (flanged) to  ensure an adequate, comfortable latch. ? Your baby's tongue should be between his or her lower gum and your breast.  Make sure that your baby's mouth is correctly positioned around your nipple (latched). Your baby's lips should create a seal on your breast and be turned out (everted).  It is common for your baby to suck about 2-3 minutes in order to start the flow of breast milk. Latching Teaching your baby how to latch onto your breast properly is very important. An improper latch can cause nipple pain, decreased milk supply, and poor weight gain in your baby. Also, if your baby is not latched onto your nipple properly, he or she may swallow some air during feeding. This can make your baby fussy. Burping your baby when you switch breasts during the feeding can help to get rid of the air. However, teaching your baby to latch on properly is still the best way to prevent fussiness from swallowing air while breastfeeding. Signs that your baby has successfully latched onto your nipple  Silent tugging or silent sucking, without causing you pain. Infant's lips should be extended outward (flanged).  Swallowing heard between every 3-4 sucks once your milk has started to flow (after your let-down milk reflex occurs).  Muscle movement above and in front of his or her ears while sucking. Signs that your baby has not successfully latched onto your nipple  Sucking sounds or smacking sounds from your baby while breastfeeding.  Nipple pain. If you think your baby has not latched on correctly, slip your finger into the corner of your baby's mouth to break the suction and place it between your baby's gums. Attempt to start breastfeeding again. Signs of successful breastfeeding Signs from your baby  Your baby will gradually decrease the number of sucks or will completely stop sucking.  Your baby will fall asleep.  Your baby's body will relax.  Your baby will retain a small amount of milk in his or her  mouth.  Your baby will let go of your breast by himself or herself. Signs from you  Breasts that have increased in firmness, weight, and size 1-3 hours after feeding.  Breasts that are softer immediately after breastfeeding.  Increased milk volume, as well as a change in milk consistency and color by the fifth day of breastfeeding.  Nipples that are not sore, cracked, or bleeding. Signs that your baby is getting enough milk  Wetting at least 1-2 diapers during the first 24 hours after birth.  Wetting at least 5-6 diapers every 24 hours for the first week after birth. The urine should be clear or pale yellow by the age of 5 days.  Wetting 6-8 diapers every 24 hours as your baby continues to grow and develop.  At least 3 stools in a 24-hour period by the age of 5 days. The stool should be soft and yellow.  At least 3 stools in a 24-hour period by the age of 7 days. The stool should be seedy and yellow.  No loss of weight greater than 10% of birth weight during the first 3 days of life.  Average weight gain   of 4-7 oz (113-198 g) per week after the age of 4 days.  Consistent daily weight gain by the age of 5 days, without weight loss after the age of 2 weeks. After a feeding, your baby may spit up a small amount of milk. This is normal. Breastfeeding frequency and duration Frequent feeding will help you make more milk and can prevent sore nipples and extremely full breasts (breast engorgement). Breastfeed when you feel the need to reduce the fullness of your breasts or when your baby shows signs of hunger. This is called "breastfeeding on demand." Signs that your baby is hungry include:  Increased alertness, activity, or restlessness.  Movement of the head from side to side.  Opening of the mouth when the corner of the mouth or cheek is stroked (rooting).  Increased sucking sounds, smacking lips, cooing, sighing, or squeaking.  Hand-to-mouth movements and sucking on fingers or  hands.  Fussing or crying. Avoid introducing a pacifier to your baby in the first 4-6 weeks after your baby is born. After this time, you may choose to use a pacifier. Research has shown that pacifier use during the first year of a baby's life decreases the risk of sudden infant death syndrome (SIDS). Allow your baby to feed on each breast as long as he or she wants. When your baby unlatches or falls asleep while feeding from the first breast, offer the second breast. Because newborns are often sleepy in the first few weeks of life, you may need to awaken your baby to get him or her to feed. Breastfeeding times will vary from baby to baby. However, the following rules can serve as a guide to help you make sure that your baby is properly fed:  Newborns (babies 4 weeks of age or younger) may breastfeed every 1-3 hours.  Newborns should not go without breastfeeding for longer than 3 hours during the day or 5 hours during the night.  You should breastfeed your baby a minimum of 8 times in a 24-hour period. Breast milk pumping     Pumping and storing breast milk allows you to make sure that your baby is exclusively fed your breast milk, even at times when you are unable to breastfeed. This is especially important if you go back to work while you are still breastfeeding, or if you are not able to be present during feedings. Your lactation consultant can help you find a method of pumping that works best for you and give you guidelines about how long it is safe to store breast milk. Caring for your breasts while you breastfeed Nipples can become dry, cracked, and sore while breastfeeding. The following recommendations can help keep your breasts moisturized and healthy:  Avoid using soap on your nipples.  Wear a supportive bra designed especially for nursing. Avoid wearing underwire-style bras or extremely tight bras (sports bras).  Air-dry your nipples for 3-4 minutes after each feeding.  Use only  cotton bra pads to absorb leaked breast milk. Leaking of breast milk between feedings is normal.  Use lanolin on your nipples after breastfeeding. Lanolin helps to maintain your skin's normal moisture barrier. Pure lanolin is not harmful (not toxic) to your baby. You may also hand express a few drops of breast milk and gently massage that milk into your nipples and allow the milk to air-dry. In the first few weeks after giving birth, some women experience breast engorgement. Engorgement can make your breasts feel heavy, warm, and tender to the touch. Engorgement   peaks within 3-5 days after you give birth. The following recommendations can help to ease engorgement:  Completely empty your breasts while breastfeeding or pumping. You may want to start by applying warm, moist heat (in the shower or with warm, water-soaked hand towels) just before feeding or pumping. This increases circulation and helps the milk flow. If your baby does not completely empty your breasts while breastfeeding, pump any extra milk after he or she is finished.  Apply ice packs to your breasts immediately after breastfeeding or pumping, unless this is too uncomfortable for you. To do this: ? Put ice in a plastic bag. ? Place a towel between your skin and the bag. ? Leave the ice on for 20 minutes, 2-3 times a day.  Make sure that your baby is latched on and positioned properly while breastfeeding. If engorgement persists after 48 hours of following these recommendations, contact your health care provider or a lactation consultant. Overall health care recommendations while breastfeeding  Eat 3 healthy meals and 3 snacks every day. Well-nourished mothers who are breastfeeding need an additional 450-500 calories a day. You can meet this requirement by increasing the amount of a balanced diet that you eat.  Drink enough water to keep your urine pale yellow or clear.  Rest often, relax, and continue to take your prenatal vitamins  to prevent fatigue, stress, and low vitamin and mineral levels in your body (nutrient deficiencies).  Do not use any products that contain nicotine or tobacco, such as cigarettes and e-cigarettes. Your baby may be harmed by chemicals from cigarettes that pass into breast milk and exposure to secondhand smoke. If you need help quitting, ask your health care provider.  Avoid alcohol.  Do not use illegal drugs or marijuana.  Talk with your health care provider before taking any medicines. These include over-the-counter and prescription medicines as well as vitamins and herbal supplements. Some medicines that may be harmful to your baby can pass through breast milk.  It is possible to become pregnant while breastfeeding. If birth control is desired, ask your health care provider about options that will be safe while breastfeeding your baby. Where to find more information: [employer]: www.llli.org Contact a health care provider if:  You feel like you want to stop breastfeeding or have become frustrated with breastfeeding.  Your nipples are cracked or bleeding.  Your breasts are red, tender, or warm.  You have: ? Painful breasts or nipples. ? A swollen area on either breast. ? A fever or chills. ? Nausea or vomiting. ? Drainage other than breast milk from your nipples.  Your breasts do not become full before feedings by the fifth day after you give birth.  You feel sad and depressed.  Your baby is: ? Too sleepy to eat well. ? Having trouble sleeping. ? More than 1 week old and wetting fewer than 6 diapers in a 24-hour period. ? Not gaining weight by 5 days of age.  Your baby has fewer than 3 stools in a 24-hour period.  Your baby's skin or the white parts of his or her eyes become yellow. Get help right away if:  Your baby is overly tired (lethargic) and does not want to wake up and feed.  Your baby develops an unexplained fever. Summary  Breastfeeding  offers many health benefits for infant and mothers.  Try to breastfeed your infant when he or she shows early signs of hunger.  Gently tickle or stroke your baby's lips with   your finger or nipple to allow the baby to open his or her mouth. Bring the baby to your breast. Make sure that much of the areola is in your baby's mouth. Offer one side and burp the baby before you offer the other side.  Talk with your health care provider or lactation consultant if you have questions or you face problems as you breastfeed. This information is not intended to replace advice given to you by your health care provider. Make sure you discuss any questions you have with your health care provider. Document Revised: 06/30/2017 Document Reviewed: 05/07/2016 Elsevier Patient Education  2020 Elsevier Inc.  

## 2020-03-18 NOTE — Progress Notes (Signed)
   Subjective:  Morgan Foster is a 34 y.o. [id number] at [redacted]w[redacted]d being seen today for ongoing prenatal care.  She is currently monitored for the following issues for this high-risk pregnancy and has Gestational diabetes mellitus (GDM), antepartum and Supervision of high risk pregnancy, antepartum on their problem list.  Patient reports vaginal leaking since this morning.  Contractions: Irregular. Vag. Bleeding: None.  Movement: Present. Denies leaking of fluid.   The following portions of the patient's history were reviewed and updated as appropriate: allergies, current medications, past family history, past medical history, past social history, past surgical history and problem list. Problem list updated.  Objective:   Vitals:   03/18/20 1437 03/18/20 1444  BP: 115/75   Pulse: 97   Weight: 235 lb 1.6 oz (106.6 kg)   Height:  5' 8.5" (1.74 m)    Fetal Status: Fetal Heart Rate (bpm): 152   Movement: Present     General:  Alert, oriented and cooperative. Patient is in no acute distress.  Skin: Skin is warm and dry. No rash noted.   Cardiovascular: Normal heart rate noted  Respiratory: Normal respiratory effort, no problems with respiration noted  Abdomen: Soft, gravid, appropriate for gestational age. Pain/Pressure: Present     Pelvic: Vag. Bleeding: None Vag D/C Character: Watery   SSE shows no pooling, neg valsalva        Extremities: Normal range of motion.  Edema: None  Mental Status: Normal mood and affect. Normal behavior. Normal judgment and thought content.   Urinalysis:      Assessment and Plan:  Pregnancy: [id number] at [redacted]w[redacted]d  1. Supervision of high risk pregnancy, antepartum BP and FHR normal Reporting leaking, neg pool, neg fern, ruled out for rupture Routine swabs collected - Culture, beta strep (group b only) - GC/Chlamydia probe amp ([place])not at [facility]  2. Gestational diabetes mellitus (GDM) controlled on oral hypoglycemic drug, antepartum Only 7/20 (35%) of  sugars at goal, a few fastings only mildly elevated, post prandials ranging 120-180's Mostly post prandials, majority of fastings at goal Last growth 11/16 with EFW >99% and mild poly, resolved on BPP on 11/23 Discussed extremely poor control of blood sugars, on dietary recall they are clearly driven by dietary indiscrection Discussed that given poor control we have a narrow window to improve her sugars or she will need to be induced Recommended starting metformin 500mg  BID, after much deliberation she accepts Will see her in 1 week, if sugars continue to be uncontrolled or non-reassuring fetal testing she will need to be induced BPP 8/8 on 11/23 Has BPP scheduled for tomorrow and growth/BPP for next week  Preterm labor symptoms and general obstetric precautions including but not limited to vaginal bleeding, contractions, leaking of fluid and fetal movement were reviewed in detail with the patient. Please refer to After Visit Summary for other counseling recommendations.  Return in 1 week (on 03/25/2020) for [facility], needs MD.   [facility], MD

## 2020-03-19 ENCOUNTER — Ambulatory Visit: Payer: Medicaid Other | Attending: Maternal & Fetal Medicine

## 2020-03-19 ENCOUNTER — Encounter: Payer: Self-pay | Admitting: *Deleted

## 2020-03-19 ENCOUNTER — Other Ambulatory Visit: Payer: Self-pay

## 2020-03-19 ENCOUNTER — Ambulatory Visit: Payer: Medicaid Other | Admitting: *Deleted

## 2020-03-19 VITALS — BP 117/60 | HR 85

## 2020-03-19 DIAGNOSIS — O24415 Gestational diabetes mellitus in pregnancy, controlled by oral hypoglycemic drugs: Secondary | ICD-10-CM

## 2020-03-19 DIAGNOSIS — Z3A36 36 weeks gestation of pregnancy: Secondary | ICD-10-CM | POA: Diagnosis not present

## 2020-03-19 DIAGNOSIS — O24419 Gestational diabetes mellitus in pregnancy, unspecified control: Secondary | ICD-10-CM | POA: Diagnosis present

## 2020-03-20 ENCOUNTER — Encounter: Payer: Medicaid Other | Attending: Obstetrics & Gynecology | Admitting: Registered"

## 2020-03-20 ENCOUNTER — Ambulatory Visit: Payer: Self-pay | Admitting: Registered"

## 2020-03-20 ENCOUNTER — Ambulatory Visit (INDEPENDENT_AMBULATORY_CARE_PROVIDER_SITE_OTHER): Payer: Self-pay | Admitting: Nurse Practitioner

## 2020-03-20 VITALS — BP 123/68 | HR 91 | Wt 236.4 lb

## 2020-03-20 DIAGNOSIS — Z3A36 36 weeks gestation of pregnancy: Secondary | ICD-10-CM

## 2020-03-20 DIAGNOSIS — O24419 Gestational diabetes mellitus in pregnancy, unspecified control: Secondary | ICD-10-CM

## 2020-03-20 DIAGNOSIS — O0990 Supervision of high risk pregnancy, unspecified, unspecified trimester: Secondary | ICD-10-CM

## 2020-03-20 LAB — GC/CHLAMYDIA PROBE AMP (~~LOC~~) NOT AT ARMC
Chlamydia: NEGATIVE
Comment: NEGATIVE
Comment: NORMAL
Neisseria Gonorrhea: NEGATIVE

## 2020-03-20 NOTE — Progress Notes (Signed)
Patient was seen on 03/20/20 for follow-up assessment and education for Gestational Diabetes. EDD 04/14/20. [redacted]w[redacted]d  Patient states she is now take vitamins and checking blood sugar.  Last MD visit Rx for metformin 500 mg BID, patient not taking. Pt states she went to Pharmacy but was not able to get metformin. RD tried calling [facility] where Pt was previously getting her medications to see if that is still an option. Pt states due to lack of transportation she is not sure she can get there to pickup medications if the [facility] that [facility] provides can only provide to appointments.  FBS:89 -101 PPBG: 88-152. Last few days PPBG has not been over 122. The one reading at 122 was after dinner she drank some OJ when the baby wasn't moving. The baby did start moving after that.  Pt states she is concerned because last night she was having contractions 5 min apart. Pt will see provider today.  Dietary recall: Breakfast: cereal, eggs, bacon Snack: fruit Lunch: salad, ham, cheese, cutie and handful of grapes, water (114 mg/dL) Dinner: bbq ribs, mashed potatoes, corn with sugar, ~6 oz orange juice (122 mg/dL)  The following learning objectives reviewed during follow-up visit:   Importance of monitoring blood sugar readings  Identifying carbs in diet  Timing of dinner and potential effect on fasting blood sugar   Plan:  . Continue to check blood sugar . 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 Contact [facility] to see if getting medications there is an option  Patient received the following handouts:  none  Patient will be seen for follow-up as needed.

## 2020-03-20 NOTE — Progress Notes (Signed)
.      Subjective:  Morgan Foster is a 34 y.o. [id number] at [redacted]w[redacted]d being seen today for ongoing prenatal care.  She is currently monitored for the following issues for this high-risk pregnancy and has Gestational diabetes mellitus (GDM), antepartum and Supervision of high risk pregnancy, antepartum on their problem list.  Patient reports regular contractions last night for several hours and wants her cervix checked.  Contractions: Irregular. Vag. Bleeding: None.  Movement: Present. Denies leaking of fluid.   The following portions of the patient's history were reviewed and updated as appropriate: allergies, current medications, past family history, past medical history, past social history, past surgical history and problem list. Problem list updated.  Objective:   Vitals:   03/20/20 1411  BP: 123/68  Pulse: 91  Weight: 236 lb 6.4 oz (107.2 kg)    Fetal Status: Fetal Heart Rate (bpm): 144   Movement: Present  Presentation: Vertex  General:  Alert, oriented and cooperative. Patient is in no acute distress.  Skin: Skin is warm and dry. No rash noted.   Cardiovascular: Normal heart rate noted  Respiratory: Normal respiratory effort, no problems with respiration noted  Abdomen: Soft, gravid, appropriate for gestational age. Pain/Pressure: Present     Pelvic:  Cervical exam performed Dilation: Fingertip Effacement (%): Thick Station: -3  Extremities: Normal range of motion.  Edema: None  Mental Status: Normal mood and affect. Normal behavior. Normal judgment and thought content.   Urinalysis:      Assessment and Plan:  Pregnancy: [id number] at [redacted]w[redacted]d  1. Supervision of high risk pregnancy, antepartum Had so many contractions last night, she thought her cervix must have changed.  Is reluctant to be induced and really wants labor to start spontaneously.  Cervix is very posterior today - seems closed today but might be open if cervix was more anterior and could be evaluated more easily  2.  Gestational diabetes mellitus (GDM), antepartum, gestational diabetes method of control unspecified Had visit earlier this week and diabetes was evaluated then   Term labor symptoms and general obstetric precautions including but not limited to vaginal bleeding, contractions, leaking of fluid and fetal movement were reviewed in detail with the patient. Please refer to After Visit Summary for other counseling recommendations.  Return in about 6 days (around 03/26/2020) for already scheduled and [place] on 03-25-20.  Nolene Bernheim, RN, MSN, NP-BC Nurse Practitioner, [facility] for [employer], [facility] 03/20/2020 3:41 PM

## 2020-03-21 ENCOUNTER — Encounter: Payer: Self-pay | Admitting: Family Medicine

## 2020-03-21 DIAGNOSIS — O99820 Streptococcus B carrier state complicating pregnancy: Secondary | ICD-10-CM | POA: Insufficient documentation

## 2020-03-21 LAB — CULTURE, BETA STREP (GROUP B ONLY): Strep Gp B Culture: POSITIVE — AB

## 2020-03-25 ENCOUNTER — Ambulatory Visit: Payer: Medicaid Other | Admitting: *Deleted

## 2020-03-25 ENCOUNTER — Encounter: Payer: Self-pay | Admitting: *Deleted

## 2020-03-25 ENCOUNTER — Ambulatory Visit: Payer: Medicaid Other | Attending: Maternal & Fetal Medicine

## 2020-03-25 ENCOUNTER — Other Ambulatory Visit: Payer: Self-pay

## 2020-03-25 VITALS — BP 113/65 | HR 101

## 2020-03-25 DIAGNOSIS — O24419 Gestational diabetes mellitus in pregnancy, unspecified control: Secondary | ICD-10-CM | POA: Insufficient documentation

## 2020-03-25 DIAGNOSIS — O24415 Gestational diabetes mellitus in pregnancy, controlled by oral hypoglycemic drugs: Secondary | ICD-10-CM | POA: Diagnosis not present

## 2020-03-25 DIAGNOSIS — Z3A37 37 weeks gestation of pregnancy: Secondary | ICD-10-CM | POA: Diagnosis not present

## 2020-03-25 NOTE — Progress Notes (Signed)
C/O" diarrhea sine 1 am this morning(x3)"

## 2020-03-26 ENCOUNTER — Ambulatory Visit (INDEPENDENT_AMBULATORY_CARE_PROVIDER_SITE_OTHER): Payer: Medicaid Other | Admitting: Family Medicine

## 2020-03-26 ENCOUNTER — Encounter: Payer: Self-pay | Admitting: Family Medicine

## 2020-03-26 ENCOUNTER — Inpatient Hospital Stay (HOSPITAL_COMMUNITY)
Admission: AD | Admit: 2020-03-26 | Discharge: 2020-03-29 | DRG: 807 | Disposition: A | Discharge status: Home / Self Care | Payer: Medicaid Other | Attending: Family Medicine | Admitting: Family Medicine

## 2020-03-26 ENCOUNTER — Encounter (HOSPITAL_COMMUNITY): Payer: Self-pay | Admitting: Obstetrics & Gynecology

## 2020-03-26 VITALS — BP 118/80 | HR 103 | Wt 227.0 lb

## 2020-03-26 DIAGNOSIS — D649 Anemia, unspecified: Secondary | ICD-10-CM | POA: Diagnosis present

## 2020-03-26 DIAGNOSIS — O24419 Gestational diabetes mellitus in pregnancy, unspecified control: Secondary | ICD-10-CM

## 2020-03-26 DIAGNOSIS — Z3A37 37 weeks gestation of pregnancy: Secondary | ICD-10-CM | POA: Diagnosis not present

## 2020-03-26 DIAGNOSIS — Z20822 Contact with and (suspected) exposure to covid-19: Secondary | ICD-10-CM | POA: Diagnosis present

## 2020-03-26 DIAGNOSIS — O9902 Anemia complicating childbirth: Secondary | ICD-10-CM | POA: Diagnosis present

## 2020-03-26 DIAGNOSIS — O24425 Gestational diabetes mellitus in childbirth, controlled by oral hypoglycemic drugs: Principal | ICD-10-CM | POA: Diagnosis present

## 2020-03-26 DIAGNOSIS — O99824 Streptococcus B carrier state complicating childbirth: Secondary | ICD-10-CM | POA: Diagnosis present

## 2020-03-26 DIAGNOSIS — O99820 Streptococcus B carrier state complicating pregnancy: Secondary | ICD-10-CM | POA: Diagnosis not present

## 2020-03-26 DIAGNOSIS — Z8759 Personal history of other complications of pregnancy, childbirth and the puerperium: Secondary | ICD-10-CM | POA: Diagnosis not present

## 2020-03-26 DIAGNOSIS — O3663X0 Maternal care for excessive fetal growth, third trimester, not applicable or unspecified: Secondary | ICD-10-CM | POA: Diagnosis present

## 2020-03-26 DIAGNOSIS — O0993 Supervision of high risk pregnancy, unspecified, third trimester: Secondary | ICD-10-CM | POA: Diagnosis not present

## 2020-03-26 DIAGNOSIS — O24430 Gestational diabetes mellitus in the puerperium, diet controlled: Secondary | ICD-10-CM | POA: Diagnosis not present

## 2020-03-26 DIAGNOSIS — O24410 Gestational diabetes mellitus in pregnancy, diet controlled: Secondary | ICD-10-CM | POA: Diagnosis not present

## 2020-03-26 DIAGNOSIS — O0990 Supervision of high risk pregnancy, unspecified, unspecified trimester: Secondary | ICD-10-CM

## 2020-03-26 DIAGNOSIS — O3660X0 Maternal care for excessive fetal growth, unspecified trimester, not applicable or unspecified: Secondary | ICD-10-CM | POA: Insufficient documentation

## 2020-03-26 LAB — BASIC METABOLIC PANEL
Anion gap: 6 (ref 5–15)
BUN: 14 mg/dL (ref 6–20)
CO2: 17 mmol/L — ABNORMAL LOW (ref 22–32)
Calcium: 8.7 mg/dL — ABNORMAL LOW (ref 8.9–10.3)
Chloride: 105 mmol/L (ref 98–111)
Creatinine, Ser: 0.63 mg/dL (ref 0.44–1.00)
GFR, Estimated: >60 mL/min (ref >60)
Glucose, Bld: 119 mg/dL — ABNORMAL HIGH (ref 70–99)
Potassium: 3.7 mmol/L (ref 3.5–5.1)
Sodium: 128 mmol/L — ABNORMAL LOW (ref 135–145)

## 2020-03-26 LAB — CBC
HCT: 28 % — ABNORMAL LOW (ref 36.0–46.0)
Hemoglobin: 9.4 g/dL — ABNORMAL LOW (ref 12.0–15.0)
MCH: 30.1 pg (ref 26.0–34.0)
MCHC: 33.6 g/dL (ref 30.0–36.0)
MCV: 89.7 fL (ref 80.0–100.0)
Platelets: 334 10*3/uL (ref 150–400)
RBC: 3.12 MIL/uL — ABNORMAL LOW (ref 3.87–5.11)
RDW: 15.2 % (ref 11.5–15.5)
WBC: 8.8 10*3/uL (ref 4.0–10.5)
nRBC: 0 % (ref 0.0–0.2)

## 2020-03-26 LAB — GLUCOSE, CAPILLARY
Glucose-Capillary: 112 mg/dL — ABNORMAL HIGH (ref 70–99)
Glucose-Capillary: 126 mg/dL — ABNORMAL HIGH (ref 70–99)
Glucose-Capillary: 128 mg/dL — ABNORMAL HIGH (ref 70–99)
Glucose-Capillary: 159 mg/dL — ABNORMAL HIGH (ref 70–99)

## 2020-03-26 LAB — RESP PANEL BY RT-PCR (FLU A&B, COVID) ARPGX2
Influenza A by PCR: NEGATIVE
Influenza B by PCR: NEGATIVE
SARS Coronavirus 2 by RT PCR: NEGATIVE

## 2020-03-26 LAB — HEMOGLOBIN A1C
Hgb A1c MFr Bld: 6.4 % — ABNORMAL HIGH (ref 4.8–5.6)
Mean Plasma Glucose: 136.98 mg/dL

## 2020-03-26 LAB — TYPE AND SCREEN
ABO/RH(D): A POS
Antibody Screen: NEGATIVE

## 2020-03-26 LAB — HEPATITIS B SURFACE ANTIGEN: Hepatitis B Surface Ag: NONREACTIVE

## 2020-03-26 MED ORDER — OXYTOCIN-SODIUM CHLORIDE 30-0.9 UT/500ML-% IV SOLN: 2.5000 [IU]/h | INTRAVENOUS | Status: DC

## 2020-03-26 MED ORDER — LIDOCAINE HCL (PF) 1 % IJ SOLN
30.0000 mL | INTRAMUSCULAR | Status: AC | PRN
  Administered 2020-03-27: 30 mL via SUBCUTANEOUS
  Filled 2020-03-26: qty 30

## 2020-03-26 MED ORDER — LACTATED RINGERS IV SOLN: 500.0000 mL | INTRAVENOUS | Status: DC | PRN

## 2020-03-26 MED ORDER — LACTATED RINGERS IV SOLN: INTRAVENOUS | Status: DC

## 2020-03-26 MED ORDER — MISOPROSTOL 50MCG HALF TABLET
50.0000 ug | ORAL_TABLET | ORAL | Status: DC
  Administered 2020-03-26 – 2020-03-27 (×2): 50 ug via BUCCAL
  Filled 2020-03-26 (×2): qty 1

## 2020-03-26 MED ORDER — OXYTOCIN-SODIUM CHLORIDE 30-0.9 UT/500ML-% IV SOLN: 1.0000 m[IU]/min | INTRAVENOUS | Status: DC

## 2020-03-26 MED ORDER — SOD CITRATE-CITRIC ACID 500-334 MG/5ML PO SOLN: 30.0000 mL | ORAL | Status: DC | PRN

## 2020-03-26 MED ORDER — ONDANSETRON HCL 4 MG/2ML IJ SOLN: 4.0000 mg | Freq: Four times a day (QID) | INTRAMUSCULAR | Status: DC | PRN

## 2020-03-26 MED ORDER — MISOPROSTOL 25 MCG QUARTER TABLET: 25.0000 ug | ORAL_TABLET | ORAL | Status: DC | PRN

## 2020-03-26 MED ORDER — PENICILLIN G POT IN DEXTROSE 60000 UNIT/ML IV SOLN
3.0000 10*6.[IU] | INTRAVENOUS | Status: DC
  Administered 2020-03-26 – 2020-03-27 (×6): 3 10*6.[IU] via INTRAVENOUS
  Filled 2020-03-26 (×6): qty 50

## 2020-03-26 MED ORDER — OXYCODONE-ACETAMINOPHEN 5-325 MG PO TABS: 2.0000 | ORAL_TABLET | ORAL | Status: DC | PRN

## 2020-03-26 MED ORDER — TERBUTALINE SULFATE 1 MG/ML IJ SOLN: 0.2500 mg | Freq: Once | INTRAMUSCULAR | Status: DC | PRN

## 2020-03-26 MED ORDER — DEXTROSE 50 % IV SOLN: 0.0000 mL | INTRAVENOUS | Status: DC | PRN

## 2020-03-26 MED ORDER — DEXTROSE IN LACTATED RINGERS 5 % IV SOLN: INTRAVENOUS | Status: DC

## 2020-03-26 MED ORDER — ACETAMINOPHEN 325 MG PO TABS: 650.0000 mg | ORAL_TABLET | ORAL | Status: DC | PRN

## 2020-03-26 MED ORDER — MISOPROSTOL 50MCG HALF TABLET
50.0000 ug | ORAL_TABLET | ORAL | Status: DC
  Administered 2020-03-26: 50 ug via VAGINAL
  Filled 2020-03-26: qty 1

## 2020-03-26 MED ORDER — OXYTOCIN BOLUS FROM INFUSION
333.0000 mL | Freq: Once | INTRAVENOUS | Status: AC
  Administered 2020-03-27: 333 mL via INTRAVENOUS

## 2020-03-26 MED ORDER — INSULIN REGULAR(HUMAN) IN NACL 100-0.9 UT/100ML-% IV SOLN
INTRAVENOUS | Status: DC
  Administered 2020-03-26: 1 [IU]/h via INTRAVENOUS
  Filled 2020-03-26 (×2): qty 100

## 2020-03-26 MED ORDER — FENTANYL CITRATE (PF) 100 MCG/2ML IJ SOLN
50.0000 ug | INTRAMUSCULAR | Status: DC | PRN
  Administered 2020-03-27: 100 ug via INTRAVENOUS
  Filled 2020-03-26: qty 2

## 2020-03-26 MED ORDER — OXYCODONE-ACETAMINOPHEN 5-325 MG PO TABS: 1.0000 | ORAL_TABLET | ORAL | Status: DC | PRN

## 2020-03-26 MED ORDER — SODIUM CHLORIDE 0.9 % IV SOLN
5.0000 10*6.[IU] | Freq: Once | INTRAVENOUS | Status: AC
  Administered 2020-03-26: 5 10*6.[IU] via INTRAVENOUS
  Filled 2020-03-26: qty 5

## 2020-03-26 NOTE — Progress Notes (Signed)
   Subjective:  Morgan Foster is a 34 y.o. [id number] at [redacted]w[redacted]d being seen today for ongoing prenatal care.  She is currently monitored for the following issues for this high-risk pregnancy and has Gestational diabetes mellitus (GDM), antepartum; Supervision of high risk pregnancy, antepartum; GBS (group B Streptococcus carrier), +RV culture, currently pregnant; LGA (large for gestational age) fetus affecting management of mother; and Indication for care in labor and delivery, antepartum on their problem list.  Patient reports no complaints.  Contractions: Irregular. Vag. Bleeding: None.  Movement: Present. Denies leaking of fluid.   The following portions of the patient's history were reviewed and updated as appropriate: allergies, current medications, past family history, past medical history, past social history, past surgical history and problem list. Problem list updated.  Objective:   Vitals:   03/26/20 1541  BP: 118/80  Pulse: (!) 103  Weight: 227 lb (103 kg)    Fetal Status: Fetal Heart Rate (bpm): 164   Movement: Present     General:  Alert, oriented and cooperative. Patient is in no acute distress.  Skin: Skin is warm and dry. No rash noted.   Cardiovascular: Normal heart rate noted  Respiratory: Normal respiratory effort, no problems with respiration noted  Abdomen: Soft, gravid, appropriate for gestational age. Pain/Pressure: Present     Pelvic: Vag. Bleeding: None     Cervical exam deferred        Extremities: Normal range of motion.  Edema: Trace  Mental Status: Normal mood and affect. Normal behavior. Normal judgment and thought content.   Urinalysis:      Assessment and Plan:  Pregnancy: [id number] at [redacted]w[redacted]d  1. Supervision of high risk pregnancy, antepartum Patient seen in MFM yesterday reporting poor control, US showing LGA infant >99% w EFW ~4300g Contacted by MFM and asked to schedule IOL this week At this visit with scant sugar log, vast majority not at  goal Recommended patient be sent to L&D for direct admit, she agreed to plan L&D charge made aware of patient, L&D attending notified as well  2. Gestational diabetes mellitus (GDM), antepartum, gestational diabetes method of control unspecified See above Unable to start metformin due to [facility] Medicaid not being activated and medication not being covered at pharmacy  3. GBS (group B Streptococcus carrier), +RV culture, currently pregnant   Term labor symptoms and general obstetric precautions including but not limited to vaginal bleeding, contractions, leaking of fluid and fetal movement were reviewed in detail with the patient. Please refer to After Visit Summary for other counseling recommendations.  Return in 6 weeks (on 05/07/2020) for PP check.   Venora Maples, MD

## 2020-03-26 NOTE — H&P (Addendum)
OBSTETRIC ADMISSION HISTORY AND PHYSICAL  Morgan Foster is a 34 y.o. female [phone] with IUP at [redacted]w[redacted]d by LMP presenting for IOL for A2GDM. She reports +FMs, No LOF, no VB, no blurry vision, headaches or peripheral edema, and RUQ pain.  She plans on breast/bottle feeding. She is undecided for birth control. She received her prenatal care at [facility]  Dating: By LMP (07/09/19) --->  Estimated Date of Delivery: 04/14/20 Sono:   03/25/20 @ [redacted]w[redacted]d, CWD, normal anatomy, cephalic presentation, 4037g, 14% EFW  Prenatal History/Complications: A2GDM poorly controlled.  Anemia (8.8 on 10/28) GBS +  Past Medical History: Past Medical History:  Diagnosis Date  . Anemia   . Anxiety   . Diabetes mellitus without complication (HCC)   . Gestational diabetes     Past Surgical History: Past Surgical History:  Procedure Laterality Date  . THERAPEUTIC ABORTION N/A     Obstetrical History: OB History    Gravida  7   Para  2   Term  2   Preterm  0   AB  4   Living  2     SAB  0   TAB  4   Ectopic  0   Multiple  0   Live Births  2           Social History Social History   Socioeconomic History  . Marital status: Single    Spouse name: Not on file  . Number of children: Not on file  . Years of education: Not on file  . Highest education level: Not on file  Occupational History  . Not on file  Tobacco Use  . Smoking status: Never Smoker  . Smokeless tobacco: Never Used  Vaping Use  . Vaping Use: Never used  Substance and Sexual Activity  . Alcohol use: Never  . Drug use: Never  . Sexual activity: Yes  Other Topics Concern  . Not on file  Social History Narrative  . Not on file   Social Determinants of Health   Financial Resource Strain:   . Difficulty of Paying Living Expenses: Not on file  Food Insecurity: No Food Insecurity  . Worried About Programme researcher, broadcasting/film/video in the Last Year: Never true  . Ran Out of Food in the Last Year: Never true   Transportation Needs: Unmet Transportation Needs  . Lack of Transportation (Medical): No  . Lack of Transportation (Non-Medical): Yes  Physical Activity:   . Days of Exercise per Week: Not on file  . Minutes of Exercise per Session: Not on file  Stress:   . Feeling of Stress : Not on file  Social Connections:   . Frequency of Communication with Friends and Family: Not on file  . Frequency of Social Gatherings with Friends and Family: Not on file  . Attends Religious Services: Not on file  . Active Member of Clubs or Organizations: Not on file  . Attends Banker Meetings: Not on file  . Marital Status: Not on file    Family History: Family History  Problem Relation Age of Onset  . Hypertension Mother   . Diabetes Mother     Allergies: No Known Allergies  Medications Prior to Admission  Medication Sig Dispense Refill Last Dose  . Ferrous Sulfate (IRON PO) Take by mouth.     . metFORMIN (GLUCOPHAGE) 500 MG tablet Take 1 tablet (500 mg total) by mouth 2 (two) times daily with a meal. 30 tablet 1   . prenatal  vitamin w/FE, FA (PRENATAL 1 + 1) 27-1 MG TABS tablet Take 1 tablet by mouth daily at 12 noon.      Review of Systems   All systems reviewed and negative except as stated in HPI  Blood pressure 124/75, pulse (!) 103, temperature 97.8 F (36.6 C), temperature source Oral, resp. rate 18, height 5' 8.5" (1.74 m), weight 103 kg, last menstrual period 07/09/2019. General appearance: alert and no distress Lungs: no dyspnea Heart: regular rate Abdomen: soft, non-tender; bowel sounds normal Pelvic: as stated below Extremities: Homans sign is negative, no sign of DVT Presentation: cephalic by SVE on admission Fetal monitoringBaseline: 150 bpm, Variability: Good {> 6 bpm), Accelerations: Reactive and Decelerations: Absent Uterine activityNone  Dilation: 2 Effacement (%): 50 Station: -2 Exam by:: Dr Criss Rosales   Prenatal labs: ABO, Rh: --/--/A POS (12/08  1745) Antibody: NEG (12/08 1745) Rubella:  non-reactive RPR: Nonreactive (10/28 0000)  HBsAg:   pending HIV: Non-reactive (10/28 0000)  GBS: Positive/-- (11/30 1617)  3 hr Glucola failed 97/169/173/132 Genetic screening  declined Anatomy US normal  Prenatal Transfer Tool  Maternal Diabetes: Yes:  Diabetes Type:  Insulin/Medication controlled, non-compliant with metformin Genetic Screening: Declined Maternal Ultrasounds/Referrals: Normal Fetal Ultrasounds or other Referrals:  Referred to Materal Fetal Medicine , no fetal echo Maternal Substance Abuse:  No Significant Maternal Medications:  None  Significant Maternal Lab Results: Group B Strep positive  Results for orders placed or performed during the hospital encounter of 03/26/20 (from the past 24 hour(s))  CBC   Collection Time: 03/26/20  5:36 PM  Result Value Ref Range   WBC 8.8 4.0 - 10.5 K/uL   RBC 3.12 (L) 3.87 - 5.11 MIL/uL   Hemoglobin 9.4 (L) 12.0 - 15.0 g/dL   HCT 60.1 (L) 36 - 46 %   MCV 89.7 80.0 - 100.0 fL   MCH 30.1 26.0 - 34.0 pg   MCHC 33.6 30.0 - 36.0 g/dL   RDW 09.3 23.5 - 57.3 %   Platelets 334 150 - 400 K/uL   nRBC 0.0 0.0 - 0.2 %  Type and screen [facility]   Collection Time: 03/26/20  5:45 PM  Result Value Ref Range   ABO/RH(D) A POS    Antibody Screen NEG    Sample Expiration      03/29/2020,2359 Performed at [facility], [address] [place], [place] [address]   Resp Panel by RT-PCR (Flu A&B, Covid) Nasopharyngeal Swab   Collection Time: 03/26/20  5:54 PM   Specimen: Nasopharyngeal Swab; Nasopharyngeal(NP) swabs in vial transport medium  Result Value Ref Range   SARS Coronavirus 2 by RT PCR NEGATIVE NEGATIVE   Influenza A by PCR NEGATIVE NEGATIVE   Influenza B by PCR NEGATIVE NEGATIVE    Patient Active Problem List   Diagnosis Date Noted  . LGA (large for gestational age) fetus affecting management of mother 03/26/2020  . Indication for care in labor and  delivery, antepartum 03/26/2020  . GBS (group B Streptococcus carrier), +RV culture, currently pregnant 03/21/2020  . Supervision of high risk pregnancy, antepartum 03/04/2020  . Gestational diabetes mellitus (GDM), antepartum 02/21/2020    Assessment/Plan:  Azaela Caracci is a 34 y.o. K2H0623 at [redacted]w[redacted]d here for IOL for A2GDM.   #IOL: Decision for IOL based on uncontrolled A2GDM and LGA. Based on SVE, FB placed and will dose cytotec. #A2GDM: Failed 3 hour GTT.  Has never taken metformin because of cost and no insurance. CBG on admission 150.  Will plan to re-check glucose in 1 hour and initiate endotool if >120. #Anemia: 9.4 on admission, taking iron supplementation daily. Recommend postpartum IV vs PO iron depending on EBL.  #Pain: Prn IV pain meds/epidural upon patient request #FWB: Cat 1 #ID: GBS pos-->PCN #MOF: breast/bottle #MOC:undecided #Circ:n/a  Trula Slade, MD  03/26/2020, 7:08 PM  GME ATTESTATION:  I saw and evaluated the patient. I agree with the findings and the plan of care as documented in the resident's note.  Alric Seton, MD OB Fellow, Faculty [facility], Center for [facility] 03/26/2020 7:14 PM

## 2020-03-26 NOTE — Patient Instructions (Addendum)

## 2020-03-26 NOTE — Progress Notes (Signed)
Labor Progress Note Morgan Foster is a 34 y.o. K5V3748 at [redacted]w[redacted]d presented for IOL for poorly controlled A2GDM  S: Feeling contractions   O:  BP (!) 151/81   Pulse 97   Temp 98.3 F (36.8 C) (Oral)   Resp 16   Ht 5' 8.5" (1.74 m)   Wt 103 kg   LMP 07/09/2019   BMI 34.01 kg/m  EFM: baseline 140/mod variability/pos accels/no decels   CVE: Dilation: 2 Effacement (%): 50 Station: -2 Presentation: Vertex Exam by:: Dr Criss Rosales   A&P: 34 y.o. [id number] [redacted]w[redacted]d IOL for poorly controlled A2GDM  #Labor: FB in place, proceed w second dose of cytotec.   #A2GDM: CBG [id number], continue endotool protocol.  #Large for gestational age: EFW 32g >99%ile. SD precautions at delivery.  #Normocytic anemia: hgb 9.4 on admission   #Pain: desires unmedicated #FWB: cat I  #GBS positive, PCN    [name], MD 11:39 PM

## 2020-03-27 ENCOUNTER — Encounter (HOSPITAL_COMMUNITY): Payer: Self-pay | Admitting: Obstetrics & Gynecology

## 2020-03-27 DIAGNOSIS — O24425 Gestational diabetes mellitus in childbirth, controlled by oral hypoglycemic drugs: Secondary | ICD-10-CM

## 2020-03-27 DIAGNOSIS — Z3A37 37 weeks gestation of pregnancy: Secondary | ICD-10-CM

## 2020-03-27 DIAGNOSIS — O99824 Streptococcus B carrier state complicating childbirth: Secondary | ICD-10-CM

## 2020-03-27 LAB — GLUCOSE, CAPILLARY
Glucose-Capillary: 102 mg/dL — ABNORMAL HIGH (ref 70–99)
Glucose-Capillary: 104 mg/dL — ABNORMAL HIGH (ref 70–99)
Glucose-Capillary: 106 mg/dL — ABNORMAL HIGH (ref 70–99)
Glucose-Capillary: 108 mg/dL — ABNORMAL HIGH (ref 70–99)
Glucose-Capillary: 108 mg/dL — ABNORMAL HIGH (ref 70–99)
Glucose-Capillary: 109 mg/dL — ABNORMAL HIGH (ref 70–99)
Glucose-Capillary: 111 mg/dL — ABNORMAL HIGH (ref 70–99)
Glucose-Capillary: 112 mg/dL — ABNORMAL HIGH (ref 70–99)
Glucose-Capillary: 117 mg/dL — ABNORMAL HIGH (ref 70–99)
Glucose-Capillary: 122 mg/dL — ABNORMAL HIGH (ref 70–99)
Glucose-Capillary: 124 mg/dL — ABNORMAL HIGH (ref 70–99)
Glucose-Capillary: 129 mg/dL — ABNORMAL HIGH (ref 70–99)
Glucose-Capillary: 134 mg/dL — ABNORMAL HIGH (ref 70–99)
Glucose-Capillary: 78 mg/dL (ref 70–99)
Glucose-Capillary: 83 mg/dL (ref 70–99)
Glucose-Capillary: 96 mg/dL (ref 70–99)
Glucose-Capillary: 97 mg/dL (ref 70–99)
Glucose-Capillary: 97 mg/dL (ref 70–99)
Glucose-Capillary: 97 mg/dL (ref 70–99)

## 2020-03-27 LAB — RUBELLA SCREEN: Rubella: 1.05 index (ref >0.99)

## 2020-03-27 LAB — RPR: RPR Ser Ql: NONREACTIVE

## 2020-03-27 MED ORDER — MISOPROSTOL 200 MCG PO TABS
800.0000 ug | ORAL_TABLET | Freq: Once | ORAL | Status: AC
  Administered 2020-03-27: 800 ug via RECTAL

## 2020-03-27 MED ORDER — TERBUTALINE SULFATE 1 MG/ML IJ SOLN: 0.2500 mg | Freq: Once | INTRAMUSCULAR | Status: DC | PRN

## 2020-03-27 MED ORDER — OXYTOCIN-SODIUM CHLORIDE 30-0.9 UT/500ML-% IV SOLN
1.0000 m[IU]/min | INTRAVENOUS | Status: DC
  Administered 2020-03-27: 2 m[IU]/min via INTRAVENOUS
  Filled 2020-03-27: qty 500

## 2020-03-27 MED ORDER — MISOPROSTOL 200 MCG PO TABS
ORAL_TABLET | ORAL | Status: AC
  Filled 2020-03-27: qty 4

## 2020-03-27 NOTE — Progress Notes (Addendum)
Labor Progress Note Morgan Foster is a 34 y.o. P5T6144 at [redacted]w[redacted]d presented for IOL for poorly controlled A2GDM  S: Patient is doing great. Feeling contractions more consistently, and working on positional changes.  O:  BP 128/84   Pulse 80   Temp 98.3 F (36.8 C) (Oral)   Resp 17   Ht 5' 8.5" (1.74 m)   Wt 103 kg   LMP 07/09/2019   BMI 34.01 kg/m  EFM: baseline 145/mod variability/pos accels/no decels   CVE: Dilation: 7 Effacement (%): 80 Cervical Position: Middle Station: 0 Presentation: Vertex Exam by:: C.O'Rear/H.Stone   A&P: 34 y.o. R1V4008 [redacted]w[redacted]d IOL for poorly controlled A2GDM  #Labor: Progressing well. Currently on 14 mili-units of Pitocin, will continue to titrate pit to complete.    #A2GDM: CBG 104>97>97; continue endotool protocol.  #Large for gestational age: EFW 88g >99%ile. SD precautions at delivery. #Normocytic anemia: hgb 9.4 on admission   #Pain: desires unmedicated #FWB: cat I  #GBS positive, PCN    Doylene Canard, Medical Student 5:55 PM  Attestation of Supervision of Student:  I confirm that I have verified the information documented in the medical student's note and that I have also personally reperformed the history, physical exam and all medical decision making activities.  I have verified that all services and findings are accurately documented in this student's note; and I agree with management and plan as outlined in the documentation. I have also made any necessary editorial changes.  Sheila Oats, MD Center for [facility], [facility] 03/27/2020 7:12 PM

## 2020-03-27 NOTE — Progress Notes (Signed)
Labor Progress Note Jocelynn Gioffre is a 34 y.o. L4D0301 at [redacted]w[redacted]d presented for IOL for poorly controlled A2GDM  S: Patient is doing great. Feeling contractions more consistently.  O:  BP (!) 112/56   Pulse 77   Temp 98.5 F (36.9 C) (Oral)   Resp 16   Ht 5' 8.5" (1.74 m)   Wt 103 kg   LMP 07/09/2019   BMI 34.01 kg/m  EFM: baseline 145/mod variability/pos accels/no decels   CVE: Dilation: 6 Effacement (%): 70 Cervical Position: Middle Station: -1 Presentation: Vertex Exam by:: Dr. Criss Rosales   A&P: 34 y.o. T1Y3888 [redacted]w[redacted]d IOL for poorly controlled A2GDM  #Labor: Progressing well. Will continue to titrate pit to complete.    #A2GDM: CBG 104>97>97 , continue endotool protocol.  #Large for gestational age: EFW 58g >99%ile. SD precautions at delivery. #Normocytic anemia: hgb 9.4 on admission   #Pain: desires unmedicated #FWB: cat I  #GBS positive, PCN    Trula Slade, MD 2:21 PM

## 2020-03-27 NOTE — Progress Notes (Addendum)
.  Labor Progress Note Morgan Foster is a 34 y.o. N2T5573 at [redacted]w[redacted]d presented for IOL for poorly controlled A2GDM  S: Feeling contractions more, denies any LOF.   O:  BP 131/66   Pulse 83   Temp 99.2 F (37.3 C) (Oral)   Resp 18   Ht 5' 8.5" (1.74 m)   Wt 103 kg   LMP 07/09/2019   BMI 34.01 kg/m  EFM: baseline 145/mod variability/pos accels/intermittent variable decel    CVE: Dilation: 5 Effacement (%): 70 Cervical Position: Middle Station: -2 Presentation: Vertex Exam by:: h stone rnc   A&P: 34 y.o. U2G2542 [redacted]w[redacted]d IOL for poorly controlled A2GDM  #Labor: cytotec x3. Started pitocin @0937 . Will continue to up-titrate as clinically indicated.  #A2GDM: CBG 111>102>106, continue endotool protocol.  #Large for gestational age: EFW 12g >99%ile. SD precautions at delivery.  #Normocytic anemia: hgb 9.4 on admission   #Pain: desires unmedicated #FWB: cat I  #GBS positive, PCN    200g, Morgan Foster 10:38 AM  Attestation of Supervision of Foster:  I confirm that I have verified the information documented in the Morgan Foster's note and that I have also personally reperformed the history, physical exam and all Morgan decision making activities.  I have verified that all services and findings are accurately documented in this Foster's note; and I agree with management and plan as outlined in the documentation. I have also made any necessary editorial changes.  Morgan Canard, MD Center for [facility], [facility] 03/27/2020 2:49 PM

## 2020-03-27 NOTE — Progress Notes (Signed)
Labor Progress Note Morgan Foster is a 34 y.o. M6N8177 at [redacted]w[redacted]d presented for IOL for poorly controlled A2GDM  S: Feeling contractions more  O:  BP 117/63   Pulse 86   Temp 98.5 F (36.9 C) (Oral)   Resp 18   Ht 5' 8.5" (1.74 m)   Wt 103 kg   LMP 07/09/2019   BMI 34.01 kg/m  EFM: baseline 145/mod variability/pos accels/intermittent variable decel    CVE: Dilation: 4.5 Effacement (%): 60 Cervical Position: Posterior Station: -2 Presentation: Vertex Exam by:: C.O'Rear   A&P: 34 y.o. N1A5790 [redacted]w[redacted]d IOL for poorly controlled A2GDM  #Labor: cytotec x3, sp FB, now out. Start pitocin at this time.   #A2GDM: CBG 111>102>106, continue endotool protocol.  #Large for gestational age: EFW 48g >99%ile. SD precautions at delivery.  #Normocytic anemia: hgb 9.4 on admission   #Pain: desires unmedicated #FWB: cat I  #GBS positive, PCN    [name], MD 7:58 AM

## 2020-03-27 NOTE — Discharge Summary (Addendum)
Postpartum Discharge Summary  Patient Name: Morgan Foster DOB: 10-21-1985 MRN: [id number]  Date of admission: 03/26/2020 Delivery date:03/27/2020  Delivering provider: Christin Fudge  Date of discharge: 03/29/2020  Admitting diagnosis: Indication for care in labor and delivery, antepartum [O75.9] Intrauterine pregnancy: [redacted]w[redacted]d    Secondary diagnosis:  Active Problems:   Indication for care in labor and delivery, antepartum  Additional problems: A2DM    Discharge diagnosis: Term Pregnancy Delivered                                              Post partum procedures: none Augmentation: AROM, Pitocin and Cytotec Complications: None  Hospital course: Induction of Labor With Vaginal Delivery   34y.o. yo [id number] 323w3das admitted to the hospital 03/26/2020 for induction of labor.  Indication for induction: A2 DM.  Patient had an uncomplicated labor course as follows: Membrane Rupture Time/Date: 8:42 PM ,03/27/2020   Delivery Method:Vaginal, Spontaneous  Episiotomy: None  Lacerations:  1st degree;Labial  Details of delivery can be found in separate delivery note.  Patient had a routine postpartum course. Patient is discharged home 03/29/20.  Newborn Data: Birth date:03/27/2020  Birth time:9:29 PM  Gender:Female  Living status:Living  Apgars:8 ,9  Weight:3685 g   Magnesium Sulfate received: No BMZ received: No Rhophylac:N/A MMR:N/A T-DaP: declined Flu: declined Transfusion:No  Physical exam  Vitals:   03/28/20 0830 03/28/20 1230 03/29/20 0118 03/29/20 0700  BP: 109/61 110/66 112/62 (!) 104/55  Pulse: 99  88 79  Resp: _0 Temp: 98.8 F (37.1 C) 98.9 F (37.2 C) 98.6 F (37 C) 98.2 F (36.8 C)  TempSrc: Oral Oral Oral Oral  SpO2: 100% 100% 100%   Weight:      Height:       General: alert, cooperative and no distress Lochia: appropriate Uterine Fundus: firm Incision: N/A DVT Evaluation: No evidence of DVT seen on physical exam. No  significant calf/ankle edema. Labs: Lab Results  Component Value Date   WBC 8.8 03/26/2020   HGB 9.4 (L) 03/26/2020   HCT 28.0 (L) 03/26/2020   MCV 89.7 03/26/2020   PLT 334 03/26/2020   CMP Latest Ref Rng & Units 03/26/2020  Glucose 70 - 99 mg/dL 119(H)  BUN 6 - 20 mg/dL 14  Creatinine 0.44 - 1.00 mg/dL 0.63  Sodium 135 - 145 mmol/L 128(L)  Potassium 3.5 - 5.1 mmol/L 3.7  Chloride 98 - 111 mmol/L 105  CO2 22 - 32 mmol/L 17(L)  Calcium 8.9 - 10.3 mg/dL 8.7(L)   EdFlavia Shippercore: No flowsheet data found.   After visit meds:  Allergies as of 03/29/2020   No Known Allergies      Medication List     STOP taking these medications    metFORMIN 500 MG tablet Commonly known as: GLUCOPHAGE       TAKE these medications    IRON PO Take by mouth.   prenatal vitamin w/FE, FA 27-1 MG Tabs tablet Take 1 tablet by mouth daily at 12 noon.         Discharge home in stable condition Infant Feeding: Breast Infant Disposition:home with mother Discharge instruction: per After Visit Summary and Postpartum booklet. Activity: Advance as tolerated. Pelvic rest for 6 weeks.  Diet: routine diet Future Appointments: Future Appointments  Date Time Provider [place]1/02/2021  8:50 AM  [facility]  04/29/2020 10:55 AM Dione Plover, Annice Needy, MD [facility]   Follow up Visit:    Please schedule this patient for a Virtual postpartum visit in 4 weeks with the following provider: Any provider. Additional Postpartum F/U:2 hour GTT  Low risk pregnancy complicated by: GDM Delivery mode:  Vaginal, Spontaneous  Anticipated Birth Control:  Unsure   03/29/2020 Donney Dice, DO  Attestation of Supervision of Student:  I confirm that I have verified the information documented in the medical students note and that I have also personally performed the history, physical exam and all medical decision making activities.  I have verified that all services and findings are  accurately documented in this student's note; and I agree with management and plan as outlined in the documentation. I have also made any necessary editorial changes.  Gabriel Carina, [place] for [employer], [place] [facility] 03/29/2020 12:04 PM

## 2020-03-27 NOTE — Progress Notes (Signed)
Labor Progress Note Morgan Foster is a 34 y.o. Q7H4193 at [redacted]w[redacted]d presented for IOL for poorly controlled A2GDM.  S: Patient doing well, contractions more apparent. Denies any concerns at this time.   O:  BP 126/73   Pulse 87   Temp 98.3 F (36.8 C) (Oral)   Resp 18   Ht 5' 8.5" (1.74 m)   Wt 103 kg   LMP 07/09/2019   BMI 34.01 kg/m  EFM: 150 bpm/moderate variability/pos accels/presence of variable decels   CVE: Dilation: 7 Effacement (%): 50 Cervical Position: Middle Station: -1 Presentation: Vertex Exam by:: Morgan Foster, CNM   A&P: 34 y.o. X9K2409 [redacted]w[redacted]d IOL for poorly controlled A2GDM.  #Labor: AROM with clear fluid and IUPC placed at 2045. Currently on 16 mili-units of Pitocin, will continue to titrate as appropriate.   #A2GDM: CBG 735>32>99>24; on endotool protocol.  #Large for gestational age: EFW 49g >99%ile. SD precautions at delivery. #Normocytic anemia: hgb 9.4 on admission  #Pain: fentanyl prn #FWB: Category 2 given few variable decels noted but still reassuring as improvement noted with maternal position changes #GBS positive, PCN    Morgan Kuck, DO 9:03 PM

## 2020-03-28 DIAGNOSIS — O24430 Gestational diabetes mellitus in the puerperium, diet controlled: Secondary | ICD-10-CM

## 2020-03-28 MED ORDER — ACETAMINOPHEN 325 MG PO TABS
650.0000 mg | ORAL_TABLET | ORAL | Status: DC | PRN
  Administered 2020-03-28: 650 mg via ORAL
  Filled 2020-03-28: qty 2

## 2020-03-28 MED ORDER — SENNOSIDES-DOCUSATE SODIUM 8.6-50 MG PO TABS
2.0000 | ORAL_TABLET | ORAL | Status: DC
  Administered 2020-03-28 – 2020-03-29 (×2): 2 via ORAL
  Filled 2020-03-28 (×2): qty 2

## 2020-03-28 MED ORDER — MAGNESIUM HYDROXIDE 400 MG/5ML PO SUSP: 30.0000 mL | ORAL | Status: DC | PRN

## 2020-03-28 MED ORDER — BENZOCAINE-MENTHOL 20-0.5 % EX AERO
1.0000 "application " | INHALATION_SPRAY | CUTANEOUS | Status: DC | PRN
  Administered 2020-03-28: 1 via TOPICAL
  Filled 2020-03-28: qty 56

## 2020-03-28 MED ORDER — DIPHENHYDRAMINE HCL 25 MG PO CAPS: 25.0000 mg | ORAL_CAPSULE | Freq: Four times a day (QID) | ORAL | Status: DC | PRN

## 2020-03-28 MED ORDER — DIBUCAINE (PERIANAL) 1 % EX OINT: 1.0000 "application " | TOPICAL_OINTMENT | CUTANEOUS | Status: DC | PRN

## 2020-03-28 MED ORDER — TETANUS-DIPHTH-ACELL PERTUSSIS 5-2.5-18.5 LF-MCG/0.5 IM SUSY: 0.5000 mL | PREFILLED_SYRINGE | Freq: Once | INTRAMUSCULAR | Status: DC

## 2020-03-28 MED ORDER — ONDANSETRON HCL 4 MG PO TABS: 4.0000 mg | ORAL_TABLET | ORAL | Status: DC | PRN

## 2020-03-28 MED ORDER — COCONUT OIL OIL: 1.0000 "application " | TOPICAL_OIL | Status: DC | PRN

## 2020-03-28 MED ORDER — PRENATAL MULTIVITAMIN CH
1.0000 | ORAL_TABLET | Freq: Every day | ORAL | Status: DC
  Administered 2020-03-28: 1 via ORAL
  Filled 2020-03-28 (×2): qty 1

## 2020-03-28 MED ORDER — ONDANSETRON HCL 4 MG/2ML IJ SOLN: 4.0000 mg | INTRAMUSCULAR | Status: DC | PRN

## 2020-03-28 MED ORDER — MEASLES, MUMPS & RUBELLA VAC IJ SOLR: 0.5000 mL | Freq: Once | INTRAMUSCULAR | Status: DC

## 2020-03-28 MED ORDER — SIMETHICONE 80 MG PO CHEW: 80.0000 mg | CHEWABLE_TABLET | ORAL | Status: DC | PRN

## 2020-03-28 MED ORDER — IBUPROFEN 600 MG PO TABS
600.0000 mg | ORAL_TABLET | Freq: Four times a day (QID) | ORAL | Status: DC
  Administered 2020-03-28 – 2020-03-29 (×6): 600 mg via ORAL
  Filled 2020-03-28 (×6): qty 1

## 2020-03-28 MED ORDER — WITCH HAZEL-GLYCERIN EX PADS: 1.0000 "application " | MEDICATED_PAD | CUTANEOUS | Status: DC | PRN

## 2020-03-28 NOTE — Progress Notes (Signed)
Post Partum Day 1 Subjective: no complaints, up ad lib, voiding and tolerating PO, small lochia, plans to breastfeed, plans to bottle feed, plans to choose birth contril outpt  Objective: Blood pressure 109/68, pulse 97, temperature 98.9 F (37.2 C), temperature source Oral, resp. rate 16, height 5' 8.5" (1.74 m), weight 103 kg, last menstrual period 07/09/2019, SpO2 100 %, unknown if currently breastfeeding.  Physical Exam:  General: alert, cooperative and no distress Lochia:normal flow Chest: CTAB Heart: RRR no m/r/g Abdomen: +BS, soft, nontender,  Uterine Fundus: firm DVT Evaluation: No evidence of DVT seen on physical exam. Extremities: trace edema  Recent Labs    03/26/20 1736  HGB 9.4*  HCT 28.0*    Assessment/Plan: Plan for discharge tomorrow   LOS: 2 days   Jacklyn Shell 03/28/2020, 8:26 AM

## 2020-03-28 NOTE — Social Work (Signed)
CSW received consult to assist MOB with formula and baby supplies. CSW met with MOB to complete assessment and offer support. MOB was observed in chair holding newborn and FOB Alexis Dubouchet bedside on couch. Parents were interested in pleasant during interaction. MOB reported she is doing well following the birth of baby Alianna Dubouchet. MOB denies any current SI or HI. CSW introduced self and role. CSW informed MOB of the reason for consult. MOB reported she would like assistance with formula and diapers. CSW requested to make a referral to [place], to assist with providing support to MOB. MOB was in agreement and provided permission to make referral. CSW asked MOB and FOB if they have resources to purchase additional items. FOB stated they are able to purchase items for baby but were requesting additional items if available. CSW expressed understanding. MOB stated she also has information for family connections. MOB stated they have all other essential items for baby, including a car seat. MOB reported she receives [facility] and is in the process of singing up for food stamps. MOB reported she applied for Medicaid but was given 45 days to cancel [place] Medicaid. MOB stated she did not cancel the Medicaid within the 45 days and must reapply. CSW expressed understanding and left a message with the Financial Navigator to assist. CSW also encouraged MOB to follow-up with Medicaid.  CSW provided education regarding the baby blues period vs. perinatal mood disorders, discussed treatment and gave resources for mental health follow up if concerns arise.  CSW recommends self-evaluation during the postpartum time period using the New Mom Checklist from Postpartum Progress and encouraged MOB to contact a medical professional if symptoms are noted at any time.   CSW provided review of Sudden Infant Death Syndrome (SIDS) precautions.  MOB reported baby will sleep in a packn'play. MOB stated she is still in  the process of choosing a Pediatrician.   CSW made referral to [facility]. CSW identifies no further need for intervention and no barriers to discharge at this time.  Darra Lis, [place] Work [employer] [phone]

## 2020-03-28 NOTE — Progress Notes (Addendum)
POSTPARTUM PROGRESS NOTE  Subjective: Morgan Foster is a 34 y.o. K3T0757 s/p SVD at [redacted]w[redacted]d  She reports she doing well. No acute events overnight. She denies any problems with ambulating, voiding or po intake. Denies nausea or vomiting. She has passed flatus. Pain is well controlled.  Lochia is mild without clotting.  Objective: Blood pressure 109/68, pulse 97, temperature 98.9 F (37.2 C), temperature source Oral, resp. rate 16, height 5' 8.5" (1.74 m), weight 103 kg, last menstrual period 07/09/2019, SpO2 100 %, unknown if currently breastfeeding.  Physical Exam:  General: alert, cooperative and no distress Chest: no respiratory distress Abdomen: soft, mild tenderness on palpation  Uterine Fundus: firm and at level of umbilicus Extremities: No calf swelling or tenderness, no LE edema noted bilaterally   Recent Labs    03/26/20 1736  HGB 9.4*  HCT 28.0*    Assessment/Plan: Morgan Foster a 34y.o. GB2Q5672s/p SVD at 31w3dor IOL secondary to poorly controlled A2GDM.  Routine Postpartum Care: Doing well, pain well-controlled.  -- Continue routine care, lactation support  -- Contraception: desires BTL, will need to do oupt as consents not signed -- Feeding: breast --A2GDM: Most recent CBG 97 wnl. --GBS positive>PCN  Dispo: Plan for discharge 1 days.  Morgan DiceDO 03/28/2020 7:34 AM  I personally saw and evaluated the patient, performing the key elements of the service. I developed and verified the management plan that is described in the resident's/student's note, and I agree with the content with my edits above. VSS, HRR&R, Resp unlabored, Legs neg.  FrNigel BertholdCNM 03/31/2020 1:35 PM

## 2020-03-28 NOTE — Social Work (Signed)
MOB was referred for history of anxiety.   * Referral screened out by Clinical Social Worker because none of the following criteria appear to apply:  ~ History of anxiety/depression during this pregnancy, or of post-partum depression following prior delivery. ~ Diagnosis of anxiety and/or depression within last 3 years. Upon chart review, MOB experienced depression following TAB in 2010.  OR * MOB's symptoms currently being treated with medication and/or therapy.  Please contact the Clinical Social Worker if needs arise, by MOB request, or if MOB scores greater than 9/yes to question 10 on Edinburgh Postpartum Depression Screen.  Ashby Leflore, LCSWA Clinical Social Work [facility]  [phone] 

## 2020-03-28 NOTE — Lactation Note (Signed)
This note was copied from a baby's chart. Lactation Consultation Note  Patient Name: Morgan Foster [id number] Date: 03/28/2020 Reason for consult: Initial assessment  Initial visit at 16 hours of life. Mom is a P3 who nursed her first two children for 2.5 yrs each (now 14 & 34 yo).  Mom said that infant wouldn't latch. When infant began cueing, I asked Mom if we could try to latch & she was agreeable. Using the teacup hold & ensuring an asymmetrical latch, the infant latched with ease. Mom does have larger nipples, but that did not impede latching. Swallows were immediately noted (frequent swallows were verified by cervical ausculation).   Mom made aware of O/P services, breastfeeding support groups, community resources, and our phone # for post-discharge questions.   Mom has no questions for me at this time.    Lurline Hare [facility] 03/28/2020, 1:32 PM

## 2020-03-29 DIAGNOSIS — Z8759 Personal history of other complications of pregnancy, childbirth and the puerperium: Secondary | ICD-10-CM

## 2020-03-29 NOTE — Lactation Note (Signed)
This note was copied from a baby's chart. Lactation Consultation Note  Patient Name: Morgan Foster [id number] Date: 03/29/2020  Baby Morgan Morgan Foster has been doing mostly bottles.  Mom said she had planned to bottle feed but has decided to some breastfeeding now because infant just latched. Mom reports she was having trouble latching her on her own. Mom pulling tissue away from her nose.  Showed Mom how to position baby Morgan so she did not have to pull tissue away from her nose. Praised breastfeeding.  Mom reports she may want assistance with latching at the next feed.  Urged to always offer the breast first.  Gave manual pump.  Encouraged mom to try and prepump prior to latching to see if that helps .Urged mom to call lactation as needed.  Mom has breastfeeding resource booklet for home use.      Maternal Data    Feeding    LATCH Score                   Interventions    Lactation Tools Discussed/Used     Consult Status      Sherline Eberwein Michaelle Copas 03/29/2020, 10:33 AM

## 2020-03-30 ENCOUNTER — Ambulatory Visit: Payer: Self-pay

## 2020-03-30 NOTE — Lactation Note (Signed)
This note was copied from a baby's chart. Lactation Consultation Note  Infant is 37 weeks and 66 hours. Mom mostly bottle feeding but is still interested in breast feeding. Upon arrival Mom just completed a bottle feeding.   LC examined Mom's breast and noted some soreness but no signs of abrasions. Her nipple on left side had more  compression more than the right.   LC gave Mom comfort gels to use and instructions to rinse them and discard after 6 days. Mom also told to not use comfort gels with coconut oil.   Plan 1. Mom to feed based on cues 8-12 x in 24 hour period no more than 4 hours without an attempt. Mom to offer both breasts and look for signs of milk transfer.           2. Mom to supplement with EBM or formula based on breastfeeding supplementation guideline and hours of age. Mom given guidelines to take home.          3. Mom to pump with her electric pump at home q 3 hours for 15 minutes to increase stimulation since she is using formula.         4. Signs, symptoms, treatment and prevention of engorgement reviewed         5. [facility] brochure of inpatient and outpatient services reviewed.

## 2020-04-08 ENCOUNTER — Other Ambulatory Visit: Payer: Self-pay

## 2020-04-24 ENCOUNTER — Other Ambulatory Visit: Payer: Self-pay

## 2020-04-24 ENCOUNTER — Ambulatory Visit (INDEPENDENT_AMBULATORY_CARE_PROVIDER_SITE_OTHER): Payer: Self-pay | Admitting: Obstetrics & Gynecology

## 2020-04-24 ENCOUNTER — Encounter: Payer: Self-pay | Admitting: Obstetrics & Gynecology

## 2020-04-24 DIAGNOSIS — Z8632 Personal history of gestational diabetes: Secondary | ICD-10-CM

## 2020-04-24 DIAGNOSIS — Z8619 Personal history of other infectious and parasitic diseases: Secondary | ICD-10-CM | POA: Insufficient documentation

## 2020-04-24 NOTE — Progress Notes (Signed)
2   Post Partum Visit Note  Morgan Foster is a 35 y.o. [phone] female who presents for a postpartum visit. She is 4 weeks postpartum following a normal spontaneous vaginal delivery.  I have fully reviewed the prenatal and intrapartum course. The delivery was at [redacted]w[redacted]d.  Anesthesia: none. Postpartum course has been uncomplicated. Baby is doing well.  Baby is feeding by both breast and bottle - Similac. Bleeding no bleeding. Bowel function is abnormal: BM every other day, hard to pass. Bladder function is normal. Patient is not sexually active. Contraception method is undecided.  FOB is incarcerated at this time.  Postpartum depression screening: negative.  The pregnancy intention screening data noted above was reviewed. Potential methods of contraception were discussed. The patient elected to proceed with Abstinence.   The following portions of the patient's history were reviewed and updated as appropriate: allergies, current medications, past family history, past medical history, past social history, past surgical history and problem list.  Review of Systems Pertinent items noted in HPI and remainder of comprehensive ROS otherwise negative.    Objective:  LMP 07/09/2019    General:  alert and cooperative   Breasts:  inspection negative, no nipple discharge or bleeding, no masses or nodularity palpable  Lungs: clear to auscultation bilaterally  Heart:  regular rate and rhythm, S1, S2 normal, no murmur, click, rub or gallop  Abdomen: soft, non-tender; bowel sounds normal; no masses,  no organomegaly   Vulva:  not evaluated  Vagina: not evaluated  Cervix:  not evaluated  Corpus: not evaluated  Adnexa:  not evaluated  Rectal Exam: not evaluted        Assessment:    Postpartum exam. Pap smear not done at today's visit. Release will be signed.   Plan:   Essential components of care per ACOG recommendations:  1.  Mood and well being: Patient with negative depression screening today.  Reviewed local resources for support.  - Patient does not use tobacco. - hx of drug use? No   2. Infant care and feeding:  -Patient currently breastmilk feeding? Yes  -Social determinants of health (SDOH) reviewed in EPIC. No concerns.  3. Sexuality, contraception and birth spacing - Patient does not want a pregnancy in the next year.  Desired family size is 3 children.  - Reviewed forms of contraception in tiered fashion. Patient desired IUD.  Currently she is not SA and FOB is incarcerated.  - Discussed birth spacing of 18 months  4. Sleep and fatigue -Encouraged family/partner/community support of 4 hrs of uninterrupted sleep to help with mood and fatigue  5. Physical Recovery  - Discussed patients delivery and complications - Patient had a 1st degree laceration, perineal healing reviewed. Patient expressed understanding - Patient has urinary incontinence? No  - Patient is safe to resume physical and sexual activity  6.  Health Maintenance - Last pap smear done July 2021 per patient; record requested from [facility]   7. GDM - She will return for post partum 2 hour GTT  M. Leda Quail, MD [facility] for [facility], [facility]

## 2020-04-29 ENCOUNTER — Other Ambulatory Visit: Payer: Self-pay

## 2020-04-29 ENCOUNTER — Ambulatory Visit: Payer: Self-pay | Admitting: Family Medicine

## 2020-04-29 DIAGNOSIS — Z8632 Personal history of gestational diabetes: Secondary | ICD-10-CM

## 2020-04-30 ENCOUNTER — Other Ambulatory Visit: Payer: Self-pay

## 2020-05-01 LAB — GLUCOSE TOLERANCE, 2 HOURS
Glucose, 2 hour: 114 mg/dL (ref 65–139)
Glucose, GTT - Fasting: 81 mg/dL (ref 65–99)

## 2020-05-06 ENCOUNTER — Telehealth: Payer: Self-pay

## 2020-05-06 NOTE — Telephone Encounter (Signed)
-----   Message from Jerene Bears, MD sent at 05/02/2020  6:00 AM EST ----- Please let pt know her post partum GTT did not show diabetes any longer.  She does have lifetime risk of developing diabetes but she does not need to check blood sugars or need any treatment at this time.  Thanks.

## 2020-05-06 NOTE — Telephone Encounter (Signed)
Called Pt to advise of test results of PP Gtt showing negative, Pt verbalized understanding.

## 2021-06-19 IMAGING — US US MFM FETAL BPP W/O NON-STRESS
1 series · 13 of 28 positions shown · non-contrast
Comparison: none

[Series 1: us mfm fetal bpp w/o non-stress · 30 acquisitions, 13 frames shown]
[im 2/30]
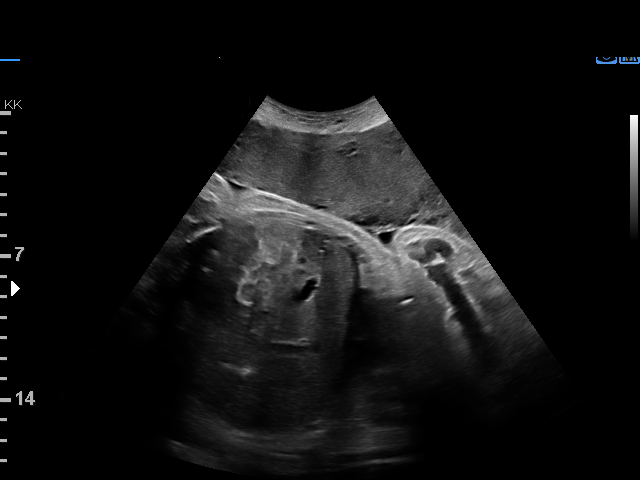
[im 4/30]
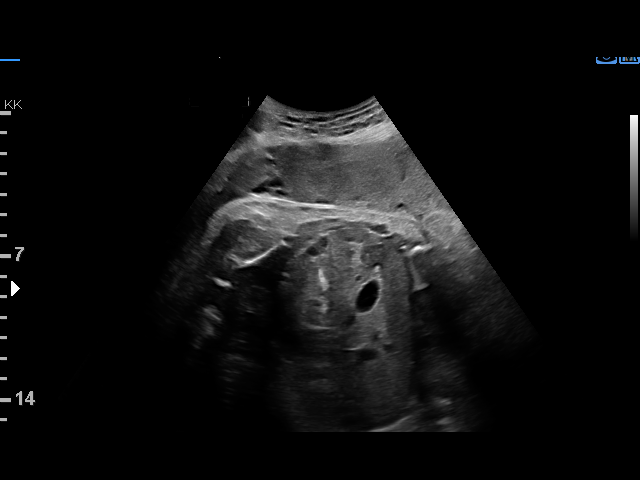
[im 6/30]
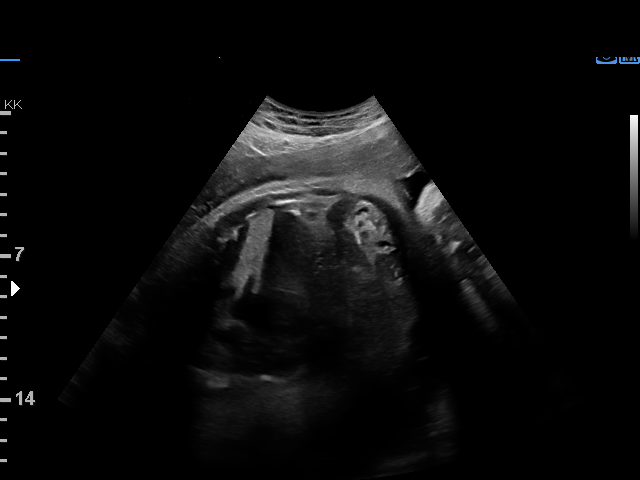
[im 8/30]
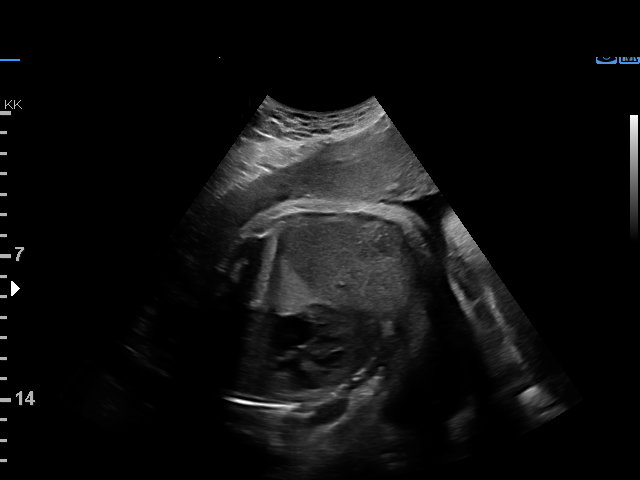
[im 10/30]
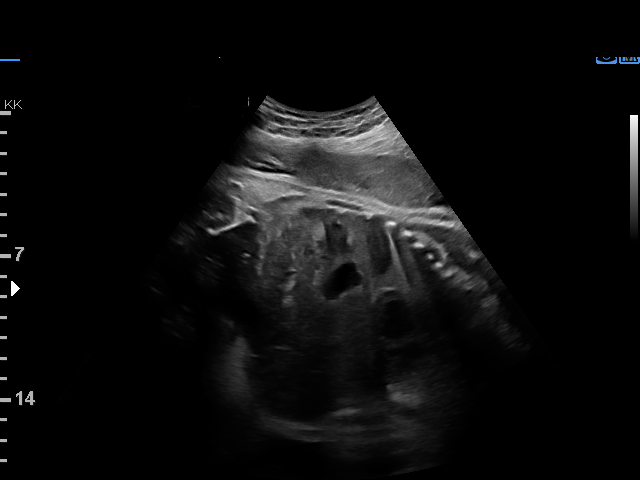
[im 12/30]
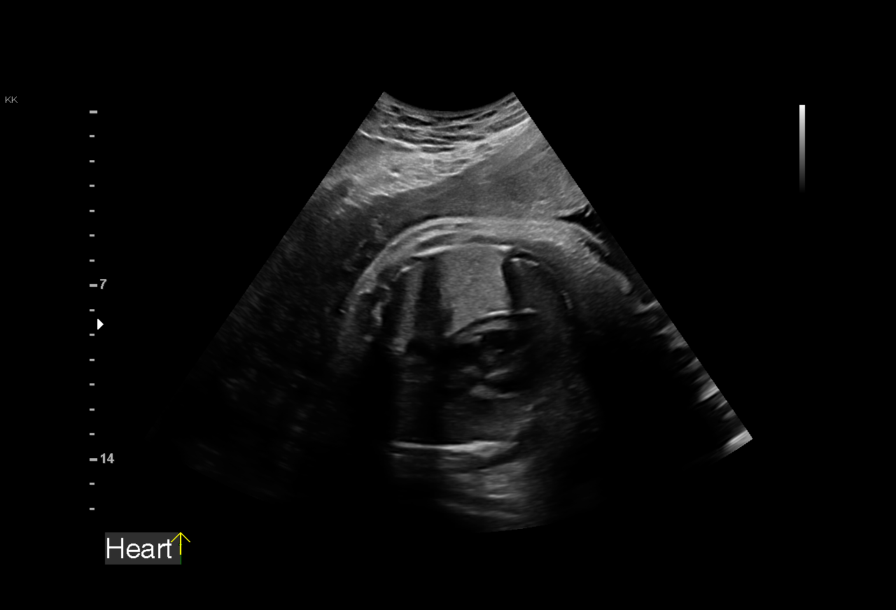
[im 16/30]
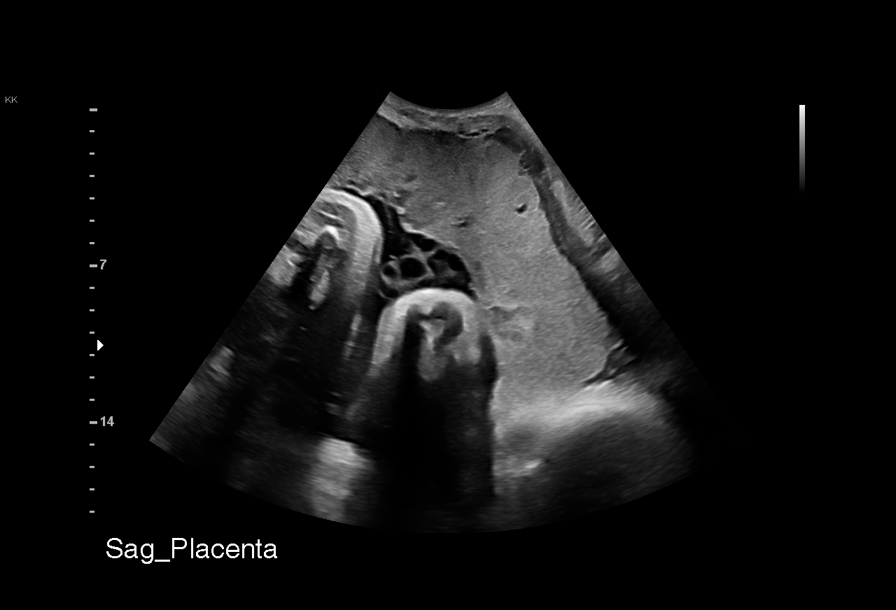
[im 18/30]
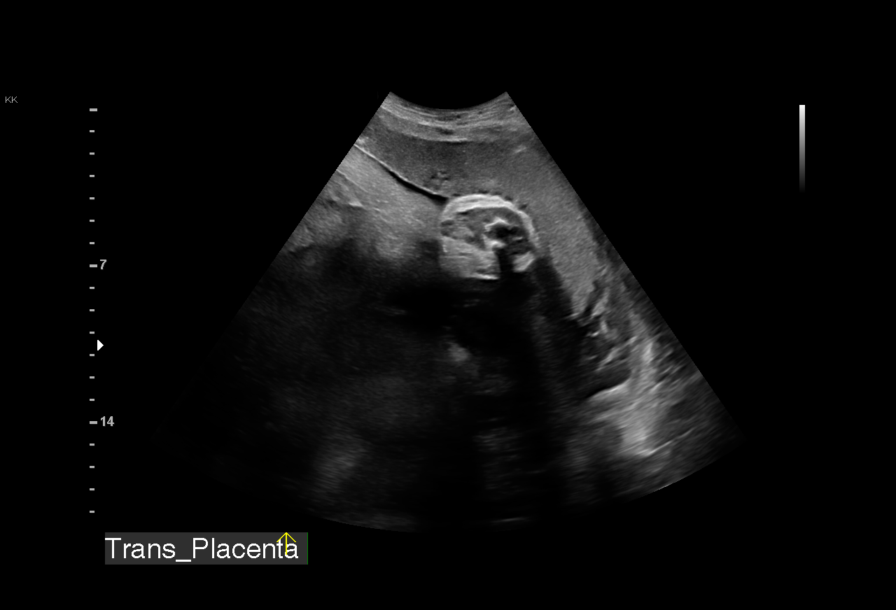
[im 20/30]
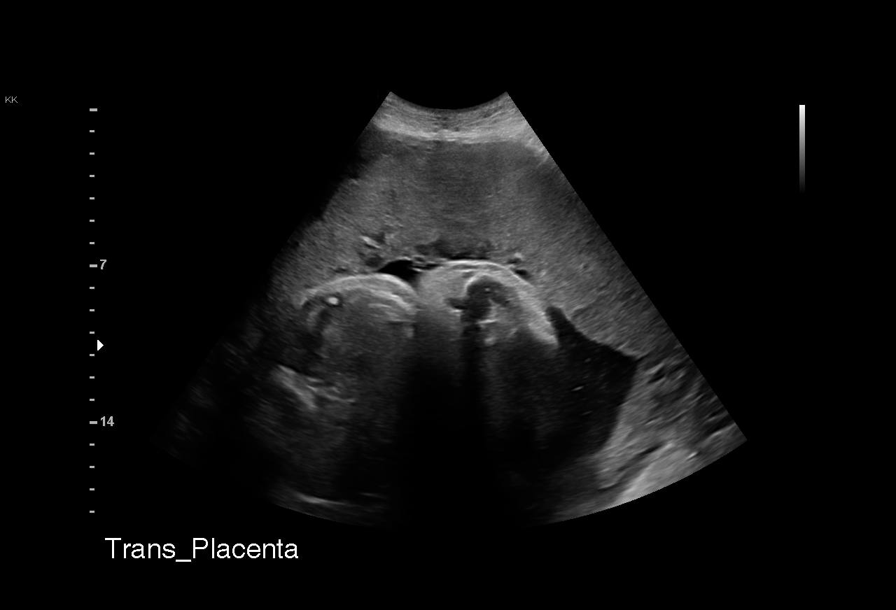
[im 22/30]
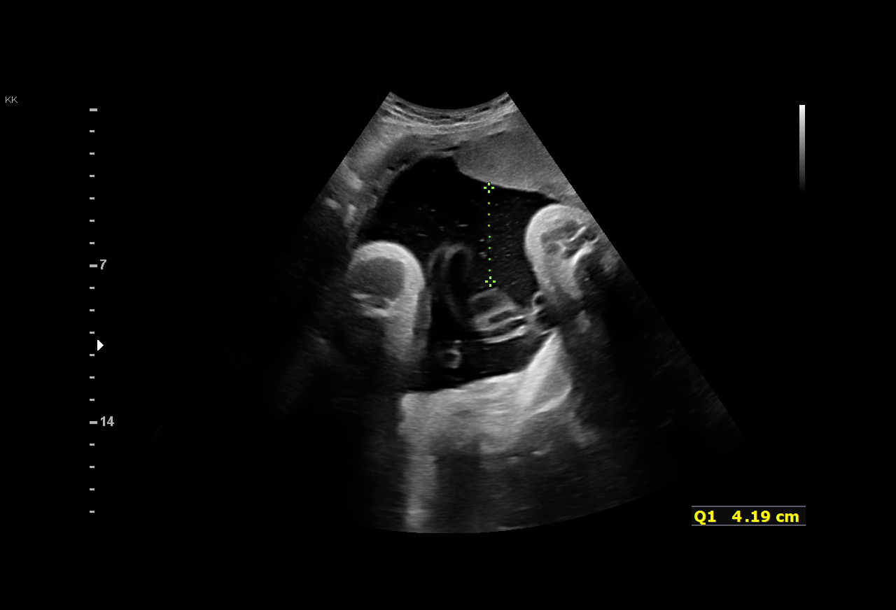
[im 24/30]
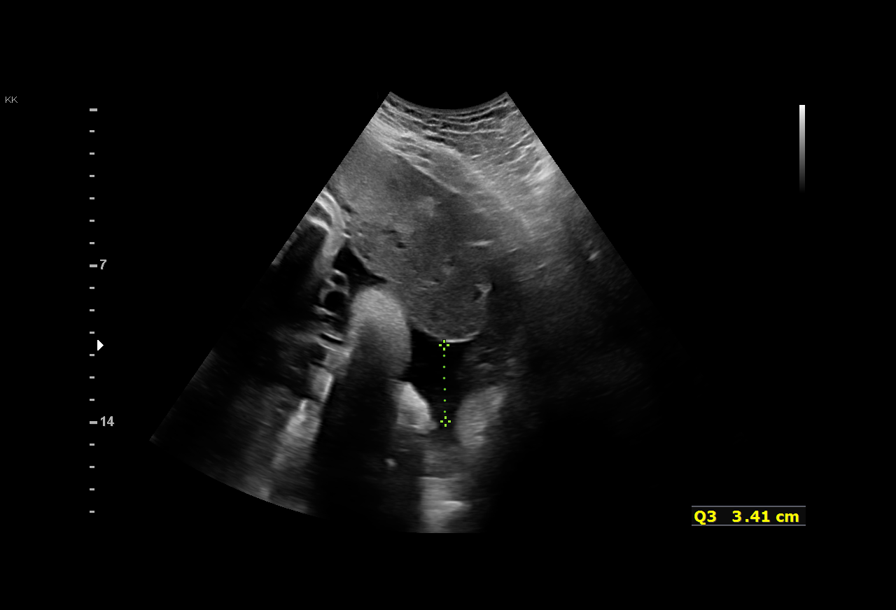
[im 26/30]
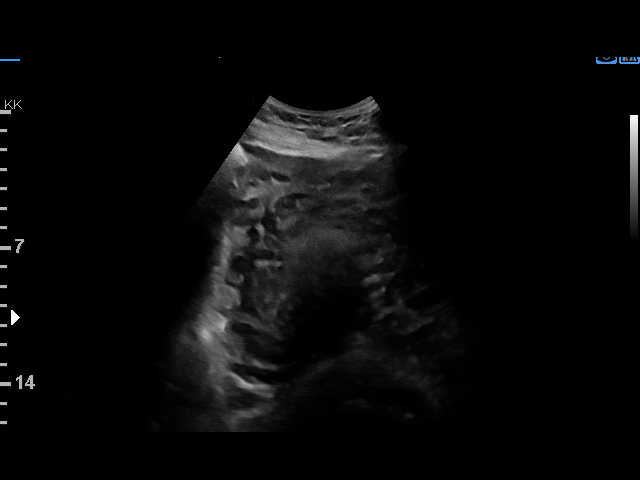
[im 28/30]
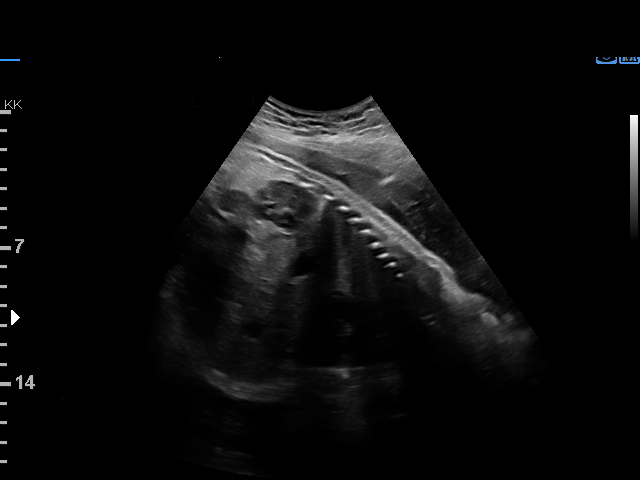

[13 of 28 positions shown; findings below may reference images not displayed]

Indications

 Gestational diabetes in pregnancy,
 controlled by oral hypoglycemic drugs
 36 weeks gestation of pregnancy
Fetal Evaluation

 Num Of Fetuses:         1
 Fetal Heart Rate(bpm):  133
 Cardiac Activity:       Observed
 Presentation:           Cephalic
 Placenta:               Anterior
 P. Cord Insertion:      Previously Visualized

 Amniotic Fluid
 AFI FV:      Within normal limits

 AFI Sum(cm)     %Tile       Largest Pocket(cm)
 12.79           43

 RUQ(cm)       RLQ(cm)       LUQ(cm)        LLQ(cm)

Biophysical Evaluation

 Amniotic F.V:   Pocket => 2 cm             F. Tone:        Observed
 F. Movement:    Observed                   Score:          [DATE]
 F. Breathing:   Observed
OB History

 Gravidity:    7         Term:   2        Prem:   0        SAB:   0
 TOP:          4       Ectopic:  0        Living: 2
Gestational Age

 LMP:           36w 2d        Date:  07/09/19                 EDD:   04/14/20
 Best:          36w 2d     Det. By:  LMP  (07/09/19)          EDD:   04/14/20
Anatomy

 Stomach:               Visualized             Bladder:                Visualized
Impression

 Gestational diabetes.  Patient was prescribed Metformin and
 has not started taking her medications yet.  She reports her
 fasting levels are slightly increased (today it was 98 mg/DL).
 Amniotic fluid is normal and good fetal activity is seen
 .Antenatal testing is reassuring. BPP [DATE].

 I reassured the patient of the findings.  I emphasized the
 importance of good blood glucose control to prevent adverse
 fetal or neonatal outcomes.
Recommendations

 -Continue weekly BPP till delivery.
                 Saliba, Marilee
